# Patient Record
Sex: Male | Born: 1955 | Race: White | Hispanic: No | Smoking: Never smoker
Health system: Southern US, Community
[De-identification: ages and names within clinical notes are randomized; demographics above are authoritative.]

## PROBLEM LIST (undated history)

## (undated) DIAGNOSIS — M5432 Sciatica, left side: Secondary | ICD-10-CM

## (undated) DIAGNOSIS — E291 Testicular hypofunction: Secondary | ICD-10-CM

## (undated) DIAGNOSIS — M1990 Unspecified osteoarthritis, unspecified site: Secondary | ICD-10-CM

## (undated) DIAGNOSIS — K219 Gastro-esophageal reflux disease without esophagitis: Secondary | ICD-10-CM

## (undated) HISTORY — DX: Sciatica, left side: M54.32

## (undated) HISTORY — PX: COLONOSCOPY W/ POLYPECTOMY: SHX1380

## (undated) HISTORY — DX: Testicular hypofunction: E29.1

## (undated) HISTORY — PX: WISDOM TOOTH EXTRACTION: SHX21

---

## 2003-03-12 HISTORY — PX: LUMBAR MICRODISCECTOMY: SHX99

## 2003-05-31 ENCOUNTER — Ambulatory Visit (HOSPITAL_COMMUNITY): Admission: RE | Admit: 2003-05-31 | Discharge: 2003-05-31 | Payer: Self-pay | Admitting: Internal Medicine

## 2003-06-02 ENCOUNTER — Ambulatory Visit (HOSPITAL_COMMUNITY): Admission: RE | Admit: 2003-06-02 | Discharge: 2003-06-02 | Payer: Self-pay | Admitting: Internal Medicine

## 2003-07-14 ENCOUNTER — Encounter: Admission: RE | Admit: 2003-07-14 | Discharge: 2003-07-14 | Payer: Self-pay | Admitting: Neurosurgery

## 2003-07-29 ENCOUNTER — Encounter: Admission: RE | Admit: 2003-07-29 | Discharge: 2003-07-29 | Payer: Self-pay | Admitting: Neurosurgery

## 2003-08-15 ENCOUNTER — Encounter: Admission: RE | Admit: 2003-08-15 | Discharge: 2003-08-15 | Payer: Self-pay | Admitting: Neurosurgery

## 2003-10-03 ENCOUNTER — Ambulatory Visit (HOSPITAL_COMMUNITY): Admission: RE | Admit: 2003-10-03 | Discharge: 2003-10-03 | Payer: Self-pay | Admitting: Neurosurgery

## 2007-08-24 ENCOUNTER — Ambulatory Visit (HOSPITAL_COMMUNITY): Admission: RE | Admit: 2007-08-24 | Discharge: 2007-08-24 | Payer: Self-pay | Admitting: Neurosurgery

## 2007-12-08 ENCOUNTER — Ambulatory Visit: Payer: Self-pay | Admitting: Internal Medicine

## 2010-07-27 NOTE — Op Note (Signed)
NAME:  Harry Levy, Harry Levy                         ACCOUNT NO.:  [id number]   MEDICAL RECORD NO.:  [id number]                   PATIENT TYPE:  OIB   LOCATION:  2899                                 FACILITY:  [facility]   PHYSICIAN:  Kathaleen Maser. Pool, M.D.                 DATE OF BIRTH:  1955-04-11   DATE OF PROCEDURE:  10/03/2003  DATE OF DISCHARGE:                                 OPERATIVE REPORT   ATTENDING PHYSICIAN:  Dr. Julio Sicks.   SERVICE:  Neurosurgery.   PREOPERATIVE DIAGNOSIS:  Left L5-S1 herniated nucleus pulposus with  radiculopathy.   POSTOPERATIVE DIAGNOSIS:  Left L5-S1 herniated nucleus pulposus with  radiculopathy.   PROCEDURE NAME:  Left L5-S1 laminotomy and microdiskectomy.   SURGEON:  Dr. Jordan Likes.   ASSISTANT:  Dr. Wynetta Emery.   ANESTHESIA:  General endotracheal.   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  He  has failed conservative management.  Work up demonstrates evidence of an  enlarged left-sided L5-S1 disk herniation with compression of the thecal sac  and left-sided S1 nerve root.  The patient has been counseled as to his  options.  He has decided to proceed with the left-sided L5-S1 laminotomy and  microdiskectomy in hopes of improving his symptoms.   OPERATIVE NOTE:  The patient was brought to the operating room, placed on  the table in a supine position.  After adequate general endotracheal  anesthesia was achieved, the patient was positioned prone onto a Wilson  frame and appropriately padded.  The lumbar spine was prepped and draped  sterilely.  A 10 blade is used to make a linear skin incision overlying the  L5-S1 interspace.  This was carried down sharply in the midline.  Subperiosteal dissection was performed including the lamina centrally with  the L5-S1 on the left.  A deep self-retaining retractor  was placed.  Intraoperative x-rays were taken.  The  level was confirmed.  A laminotomy  was then performed using a high speed drill and Kerrison rongeurs to remove  the inferior edge of the lamina of L5, medial edge of the L5-S1 facet  joints, superior rim of the S1 lamina.  The ligamentum flavum was then  elevated and resected in the usual fashion using Kerrison rongeurs whereupon  the thecal sac and exiting S1 nerve root were identified.  I performed a  microdissection of the left-sided S1 nerve root, underlying disk herniation.  Epidural veins were flushed, coagulated and cut.  The thecal sac and S1  nerve root were gradually mobilized and carried towards the midline.  A  large amount of free fragment was encountered and dissected free using blunt  nerve hooks.  These were restricted using pituitary rongeurs.  The annulus  itself was grossly torn and bulged upward.  This was then incised with a  15  blade in Hagar fashion.  A wide disk space clean-out was then achieved using  pituitary rongeurs, up and down __________ rongeurs and Epstein curets.  All  elements of the disk herniation were completely excised.  There was no  evidence of any loose debris or disk material remaining in the interspace.  The wound was then irrigated with antibiotic solution.  Gelfoam was placed  topically.  Hemostasis was found to be good.  The thecal sac and nerve roots  were free from injury.  There was no evidence of any further compression.  The wound was then inspected for hemostasis and found to be good.  Gelfoam  was placed topically.  The microscope was removed.  Hemostasis of the  musculature was achieved with electrocautery.  The wound was then closed in  layers with Vicryl sutures.  Steri-Strips and sterile dressings were  applied.  There were no complications.  The patient tolerated the procedure  well and he returns to the recovery room postoperatively.                                               Henry A. Pool, M.D.    HAP/MEDQ  D:  10/03/2003   T:  10/03/2003  Job:  [id number]

## 2013-05-13 ENCOUNTER — Telehealth: Payer: Self-pay | Admitting: Internal Medicine

## 2013-05-13 MED ORDER — AZITHROMYCIN 250 MG PO TABS: ORAL_TABLET | ORAL | Status: DC

## 2013-05-13 NOTE — Telephone Encounter (Signed)
Sinusitis sx's x 1-2 wks - yellow d/c  Zpac

## 2013-05-24 ENCOUNTER — Telehealth: Payer: Self-pay | Admitting: Internal Medicine

## 2013-05-24 MED ORDER — AMOXICILLIN-POT CLAVULANATE 875-125 MG PO TABS: 1.0000 | ORAL_TABLET | Freq: Two times a day (BID) | ORAL | Status: DC

## 2013-05-24 NOTE — Telephone Encounter (Signed)
Sinusitis - still sick Augmentin x 10 d 1 ref

## 2014-08-10 ENCOUNTER — Encounter: Payer: Self-pay | Admitting: Internal Medicine

## 2015-05-11 DIAGNOSIS — H5213 Myopia, bilateral: Secondary | ICD-10-CM | POA: Diagnosis not present

## 2015-05-11 DIAGNOSIS — H524 Presbyopia: Secondary | ICD-10-CM | POA: Diagnosis not present

## 2015-11-24 ENCOUNTER — Other Ambulatory Visit (HOSPITAL_COMMUNITY): Payer: Self-pay | Admitting: Neurosurgery

## 2015-11-24 DIAGNOSIS — M5126 Other intervertebral disc displacement, lumbar region: Secondary | ICD-10-CM

## 2015-11-25 ENCOUNTER — Ambulatory Visit (HOSPITAL_COMMUNITY)
Admission: RE | Admit: 2015-11-25 | Discharge: 2015-11-25 | Disposition: A | Discharge status: Home / Self Care | Payer: 59 | Source: Ambulatory Visit | Attending: Neurosurgery | Admitting: Neurosurgery

## 2015-11-25 DIAGNOSIS — M5136 Other intervertebral disc degeneration, lumbar region: Secondary | ICD-10-CM | POA: Insufficient documentation

## 2015-11-25 DIAGNOSIS — M5126 Other intervertebral disc displacement, lumbar region: Secondary | ICD-10-CM | POA: Insufficient documentation

## 2015-11-25 DIAGNOSIS — M5137 Other intervertebral disc degeneration, lumbosacral region: Secondary | ICD-10-CM | POA: Diagnosis not present

## 2015-11-25 DIAGNOSIS — M5127 Other intervertebral disc displacement, lumbosacral region: Secondary | ICD-10-CM | POA: Diagnosis not present

## 2015-11-25 MED ORDER — GADOBENATE DIMEGLUMINE 529 MG/ML IV SOLN
20.0000 mL | Freq: Once | INTRAVENOUS | Status: AC | PRN
  Administered 2015-11-25: 20 mL via INTRAVENOUS

## 2015-11-30 DIAGNOSIS — M5126 Other intervertebral disc displacement, lumbar region: Secondary | ICD-10-CM | POA: Diagnosis not present

## 2015-11-30 DIAGNOSIS — Z6826 Body mass index (BMI) 26.0-26.9, adult: Secondary | ICD-10-CM | POA: Diagnosis not present

## 2015-12-01 ENCOUNTER — Other Ambulatory Visit: Payer: Self-pay | Admitting: Neurosurgery

## 2015-12-01 DIAGNOSIS — M5126 Other intervertebral disc displacement, lumbar region: Secondary | ICD-10-CM

## 2015-12-07 DIAGNOSIS — D2272 Melanocytic nevi of left lower limb, including hip: Secondary | ICD-10-CM | POA: Diagnosis not present

## 2015-12-07 DIAGNOSIS — D2212 Melanocytic nevi of left eyelid, including canthus: Secondary | ICD-10-CM | POA: Diagnosis not present

## 2015-12-07 DIAGNOSIS — L821 Other seborrheic keratosis: Secondary | ICD-10-CM | POA: Diagnosis not present

## 2015-12-07 DIAGNOSIS — D485 Neoplasm of uncertain behavior of skin: Secondary | ICD-10-CM | POA: Diagnosis not present

## 2015-12-07 DIAGNOSIS — Z85828 Personal history of other malignant neoplasm of skin: Secondary | ICD-10-CM | POA: Diagnosis not present

## 2015-12-07 DIAGNOSIS — B078 Other viral warts: Secondary | ICD-10-CM | POA: Diagnosis not present

## 2015-12-11 ENCOUNTER — Ambulatory Visit
Admission: RE | Admit: 2015-12-11 | Discharge: 2015-12-11 | Disposition: A | Discharge status: Home / Self Care | Payer: 59 | Source: Ambulatory Visit | Attending: Neurosurgery | Admitting: Neurosurgery

## 2015-12-11 ENCOUNTER — Other Ambulatory Visit: Payer: 59

## 2015-12-11 DIAGNOSIS — M5126 Other intervertebral disc displacement, lumbar region: Secondary | ICD-10-CM

## 2015-12-11 MED ORDER — IOPAMIDOL (ISOVUE-M 200) INJECTION 41%
1.0000 mL | Freq: Once | INTRAMUSCULAR | Status: AC
  Administered 2015-12-11: 1 mL via EPIDURAL

## 2015-12-11 MED ORDER — METHYLPREDNISOLONE ACETATE 40 MG/ML INJ SUSP (RADIOLOG
120.0000 mg | Freq: Once | INTRAMUSCULAR | Status: AC
  Administered 2015-12-11: 120 mg via EPIDURAL

## 2015-12-11 NOTE — Discharge Instructions (Signed)

## 2015-12-27 DIAGNOSIS — N5201 Erectile dysfunction due to arterial insufficiency: Secondary | ICD-10-CM | POA: Diagnosis not present

## 2015-12-27 DIAGNOSIS — N400 Enlarged prostate without lower urinary tract symptoms: Secondary | ICD-10-CM | POA: Diagnosis not present

## 2015-12-27 DIAGNOSIS — E291 Testicular hypofunction: Secondary | ICD-10-CM | POA: Diagnosis not present

## 2016-04-11 ENCOUNTER — Telehealth: Payer: Self-pay | Admitting: Internal Medicine

## 2016-04-11 DIAGNOSIS — Z0000 Encounter for general adult medical examination without abnormal findings: Secondary | ICD-10-CM

## 2016-04-11 NOTE — Telephone Encounter (Signed)
Patient is requesting labs to be entered before appt on 2/22 for wellness.  Please let me know when entered so I can follow up.  Thanks

## 2016-04-11 NOTE — Telephone Encounter (Signed)
p 

## 2016-04-11 NOTE — Telephone Encounter (Signed)
Done. Thx.

## 2016-04-12 NOTE — Telephone Encounter (Signed)
Advised patient that labs have been entered

## 2016-05-02 ENCOUNTER — Ambulatory Visit (INDEPENDENT_AMBULATORY_CARE_PROVIDER_SITE_OTHER): Payer: 59 | Admitting: Internal Medicine

## 2016-05-02 ENCOUNTER — Encounter: Payer: Self-pay | Admitting: Internal Medicine

## 2016-05-02 VITALS — BP 136/84 | HR 65 | Temp 98.0°F | Resp 16 | Ht 77.0 in | Wt 223.0 lb

## 2016-05-02 DIAGNOSIS — N529 Male erectile dysfunction, unspecified: Secondary | ICD-10-CM | POA: Insufficient documentation

## 2016-05-02 DIAGNOSIS — Z0000 Encounter for general adult medical examination without abnormal findings: Secondary | ICD-10-CM

## 2016-05-02 DIAGNOSIS — K219 Gastro-esophageal reflux disease without esophagitis: Secondary | ICD-10-CM | POA: Insufficient documentation

## 2016-05-02 DIAGNOSIS — M5410 Radiculopathy, site unspecified: Secondary | ICD-10-CM | POA: Insufficient documentation

## 2016-05-02 DIAGNOSIS — E291 Testicular hypofunction: Secondary | ICD-10-CM | POA: Insufficient documentation

## 2016-05-02 DIAGNOSIS — R202 Paresthesia of skin: Secondary | ICD-10-CM

## 2016-05-02 DIAGNOSIS — Z23 Encounter for immunization: Secondary | ICD-10-CM

## 2016-05-02 MED ORDER — TADALAFIL 20 MG PO TABS: 20.0000 mg | ORAL_TABLET | Freq: Every day | ORAL | 11 refills | Status: DC | PRN

## 2016-05-02 MED ORDER — NORTRIPTYLINE HCL 10 MG PO CAPS: 10.0000 mg | ORAL_CAPSULE | Freq: Every day | ORAL | 11 refills | Status: DC

## 2016-05-02 MED ORDER — SUVOREXANT 15 MG PO TABS: 15.0000 mg | ORAL_TABLET | Freq: Every evening | ORAL | 5 refills | Status: DC | PRN

## 2016-05-02 MED ORDER — VITAMIN D3 50 MCG (2000 UT) PO CAPS: 2000.0000 [IU] | ORAL_CAPSULE | Freq: Every day | ORAL | 3 refills | Status: AC

## 2016-05-02 NOTE — Patient Instructions (Signed)
Try TRE - Trauma Release Exercise  You can get a "Stress Less TRE" application  McKenzie pillow

## 2016-05-02 NOTE — Progress Notes (Signed)
Subjective:  Patient ID: Harry Levy, male    DOB: 24-Aug-1955  Age: 61 y.o. MRN: [id number]  CC: Annual Exam   HPI Harry Levy presents for well exam C/o occ LBP C/o chronic LLE tingling and numbness of the lateral leg/foot C/o insomnis x years - using Benadryl  Outpatient Medications Prior to Visit  Medication Sig Dispense Refill  . amoxicillin-clavulanate (AUGMENTIN) 875-125 MG per tablet Take 1 tablet by mouth 2 (two) times daily. 20 tablet 1   No facility-administered medications prior to visit.     ROS Review of Systems  Constitutional: Negative for appetite change, fatigue and unexpected weight change.  HENT: Negative for congestion, nosebleeds, sneezing, sore throat and trouble swallowing.   Eyes: Negative for itching and visual disturbance.  Respiratory: Negative for cough and shortness of breath.   Cardiovascular: Negative for chest pain, palpitations and leg swelling.  Gastrointestinal: Negative for abdominal distention, blood in stool, diarrhea and nausea.  Endocrine: Negative for cold intolerance.  Genitourinary: Negative for decreased urine volume, frequency, hematuria and urgency.  Musculoskeletal: Positive for back pain. Negative for gait problem, joint swelling and neck pain.  Skin: Negative for color change and rash.  Neurological: Positive for numbness. Negative for dizziness, tremors, speech difficulty, weakness and headaches.  Psychiatric/Behavioral: Positive for sleep disturbance. Negative for agitation, dysphoric mood and suicidal ideas. The patient is not nervous/anxious.   ED  Objective:  BP 136/84   Pulse 65   Temp 98 F (36.7 C) (Oral)   Resp 16   Ht 6\' 5"  (1.956 m)   Wt 223 lb (101.2 kg)   SpO2 98%   BMI 26.44 kg/m   BP Readings from Last 3 Encounters:  05/02/16 136/84  12/11/15 (!) 152/89    Wt Readings from Last 3 Encounters:  05/02/16 223 lb (101.2 kg)    Physical Exam  Constitutional: He is oriented to person, place,  and time. He appears well-developed. No distress.  NAD  HENT:  Mouth/Throat: Oropharynx is clear and moist.  Eyes: Conjunctivae are normal. Pupils are equal, round, and reactive to light.  Neck: Normal range of motion. No JVD present. No thyromegaly present.  Cardiovascular: Normal rate, regular rhythm, normal heart sounds and intact distal pulses.  Exam reveals no gallop and no friction rub.   No murmur heard. Pulmonary/Chest: Effort normal and breath sounds normal. No respiratory distress. He has no wheezes. He has no rales. He exhibits no tenderness.  Abdominal: Soft. Bowel sounds are normal. He exhibits no distension and no mass. There is no tenderness. There is no rebound and no guarding.  Musculoskeletal: Normal range of motion. He exhibits no edema or tenderness.  Lymphadenopathy:    He has no cervical adenopathy.  Neurological: He is alert and oriented to person, place, and time. He has normal reflexes. No cranial nerve deficit. He exhibits normal muscle tone. He displays a negative Romberg sign. Coordination and gait normal.  Skin: Skin is warm and dry. No rash noted.  Psychiatric: He has a normal mood and affect. His behavior is normal. Judgment and thought content normal.  Rectal per urology LS full ROM Str leg elev (-) B L buttock - tender to palpation  No results found for: WBC, HGB, HCT, PLT, GLUCOSE, CHOL, TRIG, HDL, LDLDIRECT, LDLCALC, ALT, AST, NA, K, CL, CREATININE, BUN, CO2, TSH, PSA, INR, GLUF, HGBA1C, MICROALBUR  Dg Inject Diag/thera/inc Needle/cath/plc Epi/lumb/sac W/img  Result Date: 12/11/2015 CLINICAL DATA:  Lumbosacral spondylosis. Left lower extremity L5 and S1  radiculitis. 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 FLUOROSCOPY TIME:  Radiation Exposure Index (as provided by the fluoroscopic device): 24.40 uGy*m2 If the device does not provide the exposure index:Fluoroscopy Time: 18 seconds Number of Acquired Images:  0 PROCEDURE: The procedure, risks,  benefits, and alternatives were explained to the patient. Questions regarding the procedure were encouraged and answered. The patient understands and consents to the procedure. LUMBAR EPIDURAL INJECTION: An interlaminar approach was performed on left at L4-5. The overlying skin was cleansed and anesthetized. A 20 gauge epidural needle was advanced using loss-of-resistance technique. DIAGNOSTIC EPIDURAL INJECTION: Injection of Isovue-M 200 shows a good epidural pattern with spread above and below the level of needle placement, primarily on the left no vascular opacification is seen. THERAPEUTIC EPIDURAL INJECTION: 120 Mg of Depo-Medrol mixed with 3 mL 1% lidocaine were instilled. The procedure was well-tolerated, and the patient was discharged thirty minutes following the injection in good condition. COMPLICATIONS: None IMPRESSION: Technically successful epidural injection on the left L4-5 # 1 Electronically Signed   By: San Morelle M.D.   On: 12/11/2015 15:56    Assessment & Plan:   There are no diagnoses linked to this encounter. I have discontinued Mr. Lynne Leader amoxicillin-clavulanate. I am also having him maintain his cyclobenzaprine, gabapentin, testosterone cypionate, aspirin EC, ibuprofen, Fish Oil, vitamin C, multivitamin, and CoQ10.  Meds ordered this encounter  Medications  . cyclobenzaprine (FLEXERIL) 10 MG tablet    Refill:  1  . gabapentin (NEURONTIN) 300 MG capsule    Refill:  3  . testosterone cypionate (DEPOTESTOSTERONE CYPIONATE) 200 MG/ML injection    Refill:  0  . aspirin EC 81 MG tablet    Sig: Take 81 mg by mouth daily.  Marland Kitchen ibuprofen (ADVIL,MOTRIN) 600 MG tablet    Sig: Take 600 mg by mouth 2 (two) times daily.  . Omega-3 Fatty Acids (FISH OIL) 1000 MG CAPS    Sig: Take 1 capsule by mouth daily.  . vitamin C (ASCORBIC ACID) 500 MG tablet    Sig: Take 500 mg by mouth daily.  . Multiple Vitamin (MULTIVITAMIN) tablet    Sig: Take 1 tablet by mouth daily.  . Coenzyme  Q10 (COQ10) 100 MG CAPS    Sig: Take 1 capsule by mouth daily.     Follow-up: No Follow-up on file.  Walker Kehr, MD

## 2016-05-02 NOTE — Progress Notes (Signed)
Pre-visit discussion using our clinic review tool. No additional management support is needed unless otherwise documented below in the visit note.  

## 2016-05-02 NOTE — Assessment & Plan Note (Signed)
Labs LLE

## 2016-05-02 NOTE — Assessment & Plan Note (Signed)
We discussed age appropriate health related issues, including available/recomended screening tests and vaccinations. We discussed a need for adhering to healthy diet and exercise. Labs  were ordered. All questions were answered. tDAP Shingrix when available Colon up to date

## 2016-05-02 NOTE — Assessment & Plan Note (Signed)
Cialis prn 

## 2016-05-02 NOTE — Assessment & Plan Note (Signed)
Dr Diona Fanti On shots Labs w/Urology q 6 mo

## 2016-05-02 NOTE — Assessment & Plan Note (Addendum)
LLE chronic sciatica +/- piriformis syndrome On Gabapentin Can try Lyrica Nortript at hs Rx B complex  Labs Piriformis stretch PT

## 2016-05-02 NOTE — Assessment & Plan Note (Addendum)
Labs TUMs PRN

## 2016-07-26 ENCOUNTER — Encounter: Payer: Self-pay | Admitting: Internal Medicine

## 2016-09-26 ENCOUNTER — Other Ambulatory Visit (INDEPENDENT_AMBULATORY_CARE_PROVIDER_SITE_OTHER): Payer: 59

## 2016-09-26 ENCOUNTER — Encounter: Payer: Self-pay | Admitting: Internal Medicine

## 2016-09-26 ENCOUNTER — Ambulatory Visit (INDEPENDENT_AMBULATORY_CARE_PROVIDER_SITE_OTHER): Payer: 59 | Admitting: Internal Medicine

## 2016-09-26 VITALS — BP 122/88 | HR 66 | Temp 97.7°F | Resp 16 | Ht 77.0 in | Wt 218.8 lb

## 2016-09-26 DIAGNOSIS — J029 Acute pharyngitis, unspecified: Secondary | ICD-10-CM

## 2016-09-26 DIAGNOSIS — J020 Streptococcal pharyngitis: Secondary | ICD-10-CM | POA: Insufficient documentation

## 2016-09-26 LAB — CBC WITH DIFFERENTIAL/PLATELET
BASOS PCT: 0.8 % (ref 0.0–3.0)
Basophils Absolute: 0.1 10*3/uL (ref 0.0–0.1)
EOS ABS: 0.2 10*3/uL (ref 0.0–0.7)
Eosinophils Relative: 3.3 % (ref 0.0–5.0)
HCT: 46.4 % (ref 39.0–52.0)
Hemoglobin: 16 g/dL (ref 13.0–17.0)
LYMPHS ABS: 1.2 10*3/uL (ref 0.7–4.0)
Lymphocytes Relative: 18.3 % (ref 12.0–46.0)
MCHC: 34.4 g/dL (ref 30.0–36.0)
MCV: 91.3 fl (ref 78.0–100.0)
MONO ABS: 0.6 10*3/uL (ref 0.1–1.0)
Monocytes Relative: 9.5 % (ref 3.0–12.0)
NEUTROS ABS: 4.3 10*3/uL (ref 1.4–7.7)
Neutrophils Relative %: 68.1 % (ref 43.0–77.0)
PLATELETS: 194 10*3/uL (ref 150.0–400.0)
RBC: 5.08 Mil/uL (ref 4.22–5.81)
RDW: 13.5 % (ref 11.5–15.5)
WBC: 6.4 10*3/uL (ref 4.0–10.5)

## 2016-09-26 LAB — COMPREHENSIVE METABOLIC PANEL
ALT: 21 U/L (ref 0–53)
AST: 18 U/L (ref 0–37)
Albumin: 4.1 g/dL (ref 3.5–5.2)
Alkaline Phosphatase: 34 U/L — ABNORMAL LOW (ref 39–117)
BUN: 16 mg/dL (ref 6–23)
CHLORIDE: 104 meq/L (ref 96–112)
CO2: 31 meq/L (ref 19–32)
CREATININE: 1.17 mg/dL (ref 0.40–1.50)
Calcium: 9.4 mg/dL (ref 8.4–10.5)
GFR: 67.86 mL/min (ref >60.00)
Glucose, Bld: 95 mg/dL (ref 70–99)
POTASSIUM: 4.2 meq/L (ref 3.5–5.1)
SODIUM: 139 meq/L (ref 135–145)
Total Bilirubin: 0.6 mg/dL (ref 0.2–1.2)
Total Protein: 6.7 g/dL (ref 6.0–8.3)

## 2016-09-26 LAB — MONONUCLEOSIS SCREEN: Mono Screen: NEGATIVE

## 2016-09-26 LAB — POCT RAPID STREP A (OFFICE): RAPID STREP A SCREEN: POSITIVE — AB

## 2016-09-26 MED ORDER — METHYLPREDNISOLONE 4 MG PO TBPK: ORAL_TABLET | ORAL | 0 refills | Status: DC

## 2016-09-26 MED ORDER — AMOXICILLIN 875 MG PO TABS: 875.0000 mg | ORAL_TABLET | Freq: Two times a day (BID) | ORAL | 0 refills | Status: DC

## 2016-09-26 NOTE — Progress Notes (Signed)
Subjective:  Patient ID: Harry Levy, male    DOB: 05-08-1955  Age: 61 y.o. MRN: [id number]  CC: Sore Throat   HPI Harry Levy presents for day history of sore throat, odynophagia, laryngitis, chills, fatigue, and myalgias.  Outpatient Medications Prior to Visit  Medication Sig Dispense Refill  . aspirin EC 81 MG tablet Take 81 mg by mouth daily.    . Cholecalciferol (VITAMIN D3) 2000 units capsule Take 1 capsule (2,000 Units total) by mouth daily. 100 capsule 3  . Coenzyme Q10 (COQ10) 100 MG CAPS Take 1 capsule by mouth daily.    . cyclobenzaprine (FLEXERIL) 10 MG tablet   1  . gabapentin (NEURONTIN) 300 MG capsule   3  . ibuprofen (ADVIL,MOTRIN) 600 MG tablet Take 600 mg by mouth 2 (two) times daily.    . Multiple Vitamin (MULTIVITAMIN) tablet Take 1 tablet by mouth daily.    . Omega-3 Fatty Acids (FISH OIL) 1000 MG CAPS Take 1 capsule by mouth daily.    Harry Levy Kitchen testosterone cypionate (DEPOTESTOSTERONE CYPIONATE) 200 MG/ML injection   0  . vitamin C (ASCORBIC ACID) 500 MG tablet Take 500 mg by mouth daily.    . tadalafil (CIALIS) 20 MG tablet Take 1 tablet (20 mg total) by mouth daily as needed for erectile dysfunction. 30 tablet 11  . nortriptyline (PAMELOR) 10 MG capsule Take 1-2 capsules (10-20 mg total) by mouth at bedtime. 60 capsule 11  . Suvorexant (BELSOMRA) 15 MG TABS Take 15 mg by mouth at bedtime as needed. 30 tablet 5   No facility-administered medications prior to visit.     ROS Review of Systems  Constitutional: Positive for chills and fatigue. Negative for diaphoresis and fever.  HENT: Positive for sore throat, trouble swallowing and voice change.   Eyes: Negative.   Respiratory: Negative.  Negative for cough, shortness of breath, wheezing and stridor.   Cardiovascular: Negative.  Negative for chest pain, palpitations and leg swelling.  Gastrointestinal: Negative for abdominal pain, diarrhea, nausea and vomiting.  Endocrine: Negative.   Genitourinary:  Negative.  Negative for difficulty urinating.  Musculoskeletal: Positive for myalgias. Negative for arthralgias, back pain and neck pain.  Skin: Negative for color change, pallor and rash.  Allergic/Immunologic: Negative.   Neurological: Negative.   Hematological: Negative for adenopathy. Does not bruise/bleed easily.  Psychiatric/Behavioral: Negative.     Objective:  BP 122/88 (BP Location: Left Arm, Patient Position: Sitting, Cuff Size: Normal)   Pulse 66   Temp 97.7 F (36.5 C) (Oral)   Resp 16   Ht 6\' 5"  (1.956 m)   Wt 218 lb 12 oz (99.2 kg)   SpO2 98%   BMI 25.94 kg/m   BP Readings from Last 3 Encounters:  09/26/16 122/88  05/02/16 136/84  12/11/15 (!) 152/89    Wt Readings from Last 3 Encounters:  09/26/16 218 lb 12 oz (99.2 kg)  05/02/16 223 lb (101.2 kg)    Physical Exam  Constitutional: He is oriented to person, place, and time.  Non-toxic appearance. He does not have a sickly appearance. He does not appear ill. No distress.  HENT:  Right Ear: Hearing, tympanic membrane, external ear and ear canal normal.  Left Ear: Hearing, tympanic membrane, external ear and ear canal normal.  Mouth/Throat: Mucous membranes are normal. Mucous membranes are not pale, not dry and not cyanotic. No oral lesions. No trismus in the jaw. No uvula swelling. Posterior oropharyngeal erythema present. No oropharyngeal exudate, posterior oropharyngeal edema or tonsillar abscesses.  Eyes: Conjunctivae are normal. Right eye exhibits no discharge. Left eye exhibits no discharge. No scleral icterus.  Neck: Normal range of motion. Neck supple. No thyromegaly present.  Cardiovascular: Normal rate, regular rhythm and intact distal pulses.  Exam reveals no gallop.   No murmur heard. Pulmonary/Chest: Effort normal and breath sounds normal. No respiratory distress. He has no wheezes. He has no rales. He exhibits no tenderness.  Abdominal: Soft. Bowel sounds are normal. He exhibits no distension and no  mass. There is no tenderness. There is no rebound and no guarding.  Musculoskeletal: Normal range of motion. He exhibits no edema or tenderness.  Lymphadenopathy:       Head (right side): No submental, no submandibular, no tonsillar and no occipital adenopathy present.       Head (left side): No submental, no submandibular, no tonsillar and no occipital adenopathy present.    He has no cervical adenopathy.    He has no axillary adenopathy.       Right: No supraclavicular adenopathy present.       Left: No supraclavicular adenopathy present.  Neurological: He is alert and oriented to person, place, and time.  Skin: Skin is warm and dry. No rash noted. He is not diaphoretic. No erythema.  Vitals reviewed.   Lab Results  Component Value Date   WBC 6.4 09/26/2016   HGB 16.0 09/26/2016   HCT 46.4 09/26/2016   PLT 194.0 09/26/2016   GLUCOSE 95 09/26/2016   ALT 21 09/26/2016   AST 18 09/26/2016   NA 139 09/26/2016   K 4.2 09/26/2016   CL 104 09/26/2016   CREATININE 1.17 09/26/2016   BUN 16 09/26/2016   CO2 31 09/26/2016    Dg Inject Diag/thera/inc Needle/cath/plc Epi/lumb/sac W/img  Result Date: 12/11/2015 CLINICAL DATA:  Lumbosacral spondylosis. Left lower extremity L5 and S1 radiculitis. 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 FLUOROSCOPY TIME:  Radiation Exposure Index (as provided by the fluoroscopic device): 24.40 uGy*m2 If the device does not provide the exposure index:Fluoroscopy Time: 18 seconds Number of Acquired Images:  0 PROCEDURE: The procedure, risks, benefits, and alternatives were explained to the patient. Questions regarding the procedure were encouraged and answered. The patient understands and consents to the procedure. LUMBAR EPIDURAL INJECTION: An interlaminar approach was performed on left at L4-5. The overlying skin was cleansed and anesthetized. A 20 gauge epidural needle was advanced using loss-of-resistance technique. DIAGNOSTIC EPIDURAL INJECTION:  Injection of Isovue-M 200 shows a good epidural pattern with spread above and below the level of needle placement, primarily on the left no vascular opacification is seen. THERAPEUTIC EPIDURAL INJECTION: 120 Mg of Depo-Medrol mixed with 3 mL 1% lidocaine were instilled. The procedure was well-tolerated, and the patient was discharged thirty minutes following the injection in good condition. COMPLICATIONS: None IMPRESSION: Technically successful epidural injection on the left L4-5 # 1 Electronically Signed   By: San Morelle M.D.   On: 12/11/2015 15:56    Assessment & Plan:   Manfred was seen today for sore throat.  Diagnoses and all orders for this visit:  Sore throat- rapid strep screen is positive so will treat with amoxicillin. Will offer Medrol Dosepak for symptom relief. His mono screen is negative, his CBC and CMP are normal so I don't think he has mononucleosis. -     POCT rapid strep A -     Culture, Group A Strep; Future -     CBC with Differential/Platelet; Future -  Comprehensive metabolic panel; Future -     Mononucleosis screen; Future -     methylPREDNISolone (MEDROL DOSEPAK) 4 MG TBPK tablet; TAKE AS DIRECTED  Strep throat- His rapid strep screen is mildly positive, will treat with amoxicillin. -     amoxicillin (AMOXIL) 875 MG tablet; Take 1 tablet (875 mg total) by mouth 2 (two) times daily.   I have discontinued Mr. Lynne Leader nortriptyline and Suvorexant. I am also having him start on methylPREDNISolone and amoxicillin. Additionally, I am having him maintain his cyclobenzaprine, gabapentin, testosterone cypionate, aspirin EC, ibuprofen, Fish Oil, vitamin C, multivitamin, CoQ10, tadalafil, and Vitamin D3.  Meds ordered this encounter  Medications  . methylPREDNISolone (MEDROL DOSEPAK) 4 MG TBPK tablet    Sig: TAKE AS DIRECTED    Dispense:  21 tablet    Refill:  0  . amoxicillin (AMOXIL) 875 MG tablet    Sig: Take 1 tablet (875 mg total) by mouth 2 (two) times  daily.    Dispense:  20 tablet    Refill:  0     Follow-up: Return if symptoms worsen or fail to improve.  Scarlette Calico, MD

## 2016-09-26 NOTE — Patient Instructions (Signed)
Tonsillitis Tonsillitis is an infection of the throat that causes the tonsils to become red, tender, and swollen. Tonsils are collections of lymphoid tissue at the back of the throat. Each tonsil has crevices (crypts). Tonsils help fight nose and throat infections and keep infection from spreading to other parts of the body for the first 18 months of life. What are the causes? Sudden (acute) tonsillitis is usually caused by infection with streptococcal bacteria. Long-lasting (chronic) tonsillitis occurs when the crypts of the tonsils become filled with pieces of food and bacteria, which makes it easy for the tonsils to become repeatedly infected. What are the signs or symptoms? Symptoms of tonsillitis include:  A sore throat, with possible difficulty swallowing.  White patches on the tonsils.  Fever.  Tiredness.  New episodes of snoring during sleep, when you did not snore before.  Small, foul-smelling, yellowish-white pieces of material (tonsilloliths) that you occasionally cough up or spit out. The tonsilloliths can also cause you to have bad breath.  How is this diagnosed? Tonsillitis can be diagnosed through a physical exam. Diagnosis can be confirmed with the results of lab tests, including a throat culture. How is this treated? The goals of tonsillitis treatment include the reduction of the severity and duration of symptoms and prevention of associated conditions. Symptoms of tonsillitis can be improved with the use of steroids to reduce the swelling. Tonsillitis caused by bacteria can be treated with antibiotic medicines. Usually, treatment with antibiotic medicines is started before the cause of the tonsillitis is known. However, if it is determined that the cause is not bacterial, antibiotic medicines will not treat the tonsillitis. If attacks of tonsillitis are severe and frequent, your health care provider may recommend surgery to remove the tonsils (tonsillectomy). Follow these  instructions at home:  Rest as much as possible and get plenty of sleep.  Drink plenty of fluids. While the throat is very sore, eat soft foods or liquids, such as sherbet, soups, or instant breakfast drinks.  Eat frozen ice pops.  Gargle with a warm or cold liquid to help soothe the throat. Mix 1/4 teaspoon of salt and 1/4 teaspoon of baking soda in 8 oz of water. Contact a health care provider if:  Large, tender lumps develop in your neck.  A rash develops.  A green, yellow-brown, or bloody substance is coughed up.  You are unable to swallow liquids or food for 24 hours.  You notice that only one of the tonsils is swollen. Get help right away if:  You develop any new symptoms such as vomiting, severe headache, stiff neck, chest pain, or trouble breathing or swallowing.  You have severe throat pain along with drooling or voice changes.  You have severe pain, unrelieved with recommended medications.  You are unable to fully open the mouth.  You develop redness, swelling, or severe pain anywhere in the neck.  You have a fever. This information is not intended to replace advice given to you by your health care provider. Make sure you discuss any questions you have with your health care provider. Document Released: 12/05/2004 Document Revised: 08/03/2015 Document Reviewed: 08/14/2012 Elsevier Interactive Patient Education  2017 [employer]  

## 2016-09-27 ENCOUNTER — Encounter: Payer: Self-pay | Admitting: Internal Medicine

## 2016-10-02 ENCOUNTER — Other Ambulatory Visit: Payer: Self-pay | Admitting: Internal Medicine

## 2016-10-02 DIAGNOSIS — J0100 Acute maxillary sinusitis, unspecified: Secondary | ICD-10-CM | POA: Insufficient documentation

## 2016-10-02 DIAGNOSIS — J0101 Acute recurrent maxillary sinusitis: Secondary | ICD-10-CM

## 2016-10-02 MED ORDER — AMOXICILLIN-POT CLAVULANATE 875-125 MG PO TABS: 1.0000 | ORAL_TABLET | Freq: Two times a day (BID) | ORAL | 0 refills | Status: DC

## 2017-02-03 DIAGNOSIS — E291 Testicular hypofunction: Secondary | ICD-10-CM | POA: Diagnosis not present

## 2017-02-03 DIAGNOSIS — N5201 Erectile dysfunction due to arterial insufficiency: Secondary | ICD-10-CM | POA: Diagnosis not present

## 2017-02-03 DIAGNOSIS — N400 Enlarged prostate without lower urinary tract symptoms: Secondary | ICD-10-CM | POA: Diagnosis not present

## 2017-04-15 DIAGNOSIS — D2339 Other benign neoplasm of skin of other parts of face: Secondary | ICD-10-CM | POA: Diagnosis not present

## 2017-04-15 DIAGNOSIS — B078 Other viral warts: Secondary | ICD-10-CM | POA: Diagnosis not present

## 2017-04-15 DIAGNOSIS — Z85828 Personal history of other malignant neoplasm of skin: Secondary | ICD-10-CM | POA: Diagnosis not present

## 2017-04-15 DIAGNOSIS — D485 Neoplasm of uncertain behavior of skin: Secondary | ICD-10-CM | POA: Diagnosis not present

## 2017-05-01 DIAGNOSIS — H524 Presbyopia: Secondary | ICD-10-CM | POA: Diagnosis not present

## 2017-05-01 DIAGNOSIS — H5213 Myopia, bilateral: Secondary | ICD-10-CM | POA: Diagnosis not present

## 2017-05-04 ENCOUNTER — Encounter: Payer: Self-pay | Admitting: Internal Medicine

## 2017-05-06 ENCOUNTER — Other Ambulatory Visit: Payer: Self-pay | Admitting: Internal Medicine

## 2017-05-06 DIAGNOSIS — R748 Abnormal levels of other serum enzymes: Secondary | ICD-10-CM

## 2017-05-07 ENCOUNTER — Telehealth: Payer: Self-pay

## 2017-05-07 NOTE — Telephone Encounter (Signed)
-----   Message from [place], [place] sent at 05/07/2017  8:45 AM EST ----- Regarding: FW: shingrix   ----- Message ----- From: Cassandria Anger, MD Sent: 05/06/2017   5:31 PM To: Inez Catalina Friedenbach, CMA Subject: shingrix                                       Inez Catalina, Please arrange for shingrix vaccine. Thanks, AP

## 2017-05-07 NOTE — Telephone Encounter (Signed)
Patient has been added to shingrix waitlist 

## 2017-07-01 ENCOUNTER — Encounter: Payer: Self-pay | Admitting: Internal Medicine

## 2017-07-02 ENCOUNTER — Encounter: Payer: Self-pay | Admitting: Internal Medicine

## 2017-07-03 ENCOUNTER — Telehealth: Payer: Self-pay

## 2017-07-03 NOTE — Telephone Encounter (Signed)
Left message advising patient to call back to schedule nurse visit to get first shingrix vaccine

## 2017-07-04 ENCOUNTER — Other Ambulatory Visit: Payer: Self-pay | Admitting: Internal Medicine

## 2017-07-04 MED ORDER — ZOLPIDEM TARTRATE ER 12.5 MG PO TBCR: 12.5000 mg | EXTENDED_RELEASE_TABLET | Freq: Every evening | ORAL | 3 refills | Status: DC | PRN

## 2017-07-18 ENCOUNTER — Telehealth: Payer: Self-pay

## 2017-07-18 NOTE — Telephone Encounter (Signed)
Copied from [place] [id number]. Topic: Quick Communication - Office Called Patient >> Jul 03, 2017 10:20 AM Ander Slade, RN wrote: Reason for CRM: patient needs to schedule nurse visit to get first shingrix vaccine---let Adam Sanjuan,RN at [facility] know if appt is made so that both vaccines can be labeled with patient's name  Both vaccines labeled and placed in refrig

## 2017-07-18 NOTE — Telephone Encounter (Signed)
Copied from [place] [phone]. Topic: Quick Communication - Office Called Patient >> Jul 03, 2017 10:20 AM Ander Slade, RN wrote: Reason for CRM: patient needs to schedule nurse visit to get first shingrix vaccine---let Merle Cirelli,RN at [facility] office know if appt is made so that both vaccines can be labeled with patient's name >> Jul 18, 2017  8:22 AM Robina Ade, Helene Kelp D wrote: Patient schedule shingle vaccine appt for 07/24/17

## 2017-07-24 ENCOUNTER — Ambulatory Visit (INDEPENDENT_AMBULATORY_CARE_PROVIDER_SITE_OTHER): Payer: 59 | Admitting: *Deleted

## 2017-07-24 DIAGNOSIS — Z23 Encounter for immunization: Secondary | ICD-10-CM | POA: Diagnosis not present

## 2017-10-01 ENCOUNTER — Other Ambulatory Visit (INDEPENDENT_AMBULATORY_CARE_PROVIDER_SITE_OTHER): Payer: 59

## 2017-10-01 DIAGNOSIS — R202 Paresthesia of skin: Secondary | ICD-10-CM

## 2017-10-01 DIAGNOSIS — R748 Abnormal levels of other serum enzymes: Secondary | ICD-10-CM

## 2017-10-01 DIAGNOSIS — K219 Gastro-esophageal reflux disease without esophagitis: Secondary | ICD-10-CM | POA: Diagnosis not present

## 2017-10-01 LAB — CBC WITH DIFFERENTIAL/PLATELET
BASOS ABS: 0.1 10*3/uL (ref 0.0–0.1)
Basophils Relative: 1.1 % (ref 0.0–3.0)
Eosinophils Absolute: 0.2 10*3/uL (ref 0.0–0.7)
Eosinophils Relative: 4.1 % (ref 0.0–5.0)
HEMATOCRIT: 49.5 % (ref 39.0–52.0)
HEMOGLOBIN: 16.9 g/dL (ref 13.0–17.0)
LYMPHS PCT: 27.3 % (ref 12.0–46.0)
Lymphs Abs: 1.6 10*3/uL (ref 0.7–4.0)
MCHC: 34.2 g/dL (ref 30.0–36.0)
MCV: 93.7 fl (ref 78.0–100.0)
MONOS PCT: 8.3 % (ref 3.0–12.0)
Monocytes Absolute: 0.5 10*3/uL (ref 0.1–1.0)
NEUTROS ABS: 3.4 10*3/uL (ref 1.4–7.7)
Neutrophils Relative %: 59.2 % (ref 43.0–77.0)
PLATELETS: 194 10*3/uL (ref 150.0–400.0)
RBC: 5.28 Mil/uL (ref 4.22–5.81)
RDW: 13.7 % (ref 11.5–15.5)
WBC: 5.8 10*3/uL (ref 4.0–10.5)

## 2017-10-01 LAB — HEPATIC FUNCTION PANEL
ALBUMIN: 4.1 g/dL (ref 3.5–5.2)
ALT: 28 U/L (ref 0–53)
AST: 17 U/L (ref 0–37)
Alkaline Phosphatase: 32 U/L — ABNORMAL LOW (ref 39–117)
Bilirubin, Direct: 0.1 mg/dL (ref 0.0–0.3)
TOTAL PROTEIN: 7.1 g/dL (ref 6.0–8.3)
Total Bilirubin: 0.4 mg/dL (ref 0.2–1.2)

## 2017-10-01 LAB — LIPID PANEL
Cholesterol: 170 mg/dL (ref 0–200)
HDL: 58.6 mg/dL (ref >39.00)
LDL CALC: 102 mg/dL — AB (ref 0–99)
NONHDL: 111.56
TRIGLYCERIDES: 49 mg/dL (ref 0.0–149.0)
Total CHOL/HDL Ratio: 3
VLDL: 9.8 mg/dL (ref 0.0–40.0)

## 2017-10-01 LAB — VITAMIN D 25 HYDROXY (VIT D DEFICIENCY, FRACTURES): VITD: 39.86 ng/mL (ref 30.00–100.00)

## 2017-10-02 ENCOUNTER — Ambulatory Visit (INDEPENDENT_AMBULATORY_CARE_PROVIDER_SITE_OTHER): Payer: 59

## 2017-10-02 DIAGNOSIS — Z23 Encounter for immunization: Secondary | ICD-10-CM

## 2017-10-02 LAB — HEPATITIS C ANTIBODY
Hepatitis C Ab: NONREACTIVE
SIGNAL TO CUT-OFF: 0.01 (ref <1.00)

## 2017-12-04 ENCOUNTER — Other Ambulatory Visit: Payer: Self-pay | Admitting: Internal Medicine

## 2017-12-08 ENCOUNTER — Other Ambulatory Visit: Payer: Self-pay | Admitting: Internal Medicine

## 2017-12-08 MED ORDER — ZOLPIDEM TARTRATE ER 12.5 MG PO TBCR: 12.5000 mg | EXTENDED_RELEASE_TABLET | Freq: Every evening | ORAL | 3 refills | Status: DC | PRN

## 2018-01-30 ENCOUNTER — Encounter: Payer: Self-pay | Admitting: Internal Medicine

## 2018-03-16 DIAGNOSIS — N400 Enlarged prostate without lower urinary tract symptoms: Secondary | ICD-10-CM | POA: Diagnosis not present

## 2018-03-16 DIAGNOSIS — N5201 Erectile dysfunction due to arterial insufficiency: Secondary | ICD-10-CM | POA: Diagnosis not present

## 2018-03-16 DIAGNOSIS — E291 Testicular hypofunction: Secondary | ICD-10-CM | POA: Diagnosis not present

## 2018-04-09 ENCOUNTER — Encounter: Payer: Self-pay | Admitting: Internal Medicine

## 2018-04-09 ENCOUNTER — Ambulatory Visit (INDEPENDENT_AMBULATORY_CARE_PROVIDER_SITE_OTHER): Payer: 59 | Admitting: Internal Medicine

## 2018-04-09 VITALS — BP 124/76 | HR 63 | Temp 98.0°F | Ht 77.0 in | Wt 211.0 lb

## 2018-04-09 DIAGNOSIS — Z0000 Encounter for general adult medical examination without abnormal findings: Secondary | ICD-10-CM | POA: Diagnosis not present

## 2018-04-09 DIAGNOSIS — G4700 Insomnia, unspecified: Secondary | ICD-10-CM | POA: Insufficient documentation

## 2018-04-09 DIAGNOSIS — K219 Gastro-esophageal reflux disease without esophagitis: Secondary | ICD-10-CM

## 2018-04-09 DIAGNOSIS — E291 Testicular hypofunction: Secondary | ICD-10-CM

## 2018-04-09 DIAGNOSIS — F5101 Primary insomnia: Secondary | ICD-10-CM

## 2018-04-09 MED ORDER — ZOLPIDEM TARTRATE ER 12.5 MG PO TBCR: 12.5000 mg | EXTENDED_RELEASE_TABLET | Freq: Every evening | ORAL | 1 refills | Status: DC | PRN

## 2018-04-09 MED ORDER — GABAPENTIN 300 MG PO CAPS: 300.0000 mg | ORAL_CAPSULE | Freq: Three times a day (TID) | ORAL | 3 refills | Status: DC

## 2018-04-09 MED ORDER — TRAMADOL HCL 50 MG PO TABS: 50.0000 mg | ORAL_TABLET | Freq: Four times a day (QID) | ORAL | 1 refills | Status: DC | PRN

## 2018-04-09 MED ORDER — FAMOTIDINE 40 MG PO TABS: 40.0000 mg | ORAL_TABLET | Freq: Every day | ORAL | 3 refills | Status: DC

## 2018-04-09 MED ORDER — CYCLOBENZAPRINE HCL 10 MG PO TABS: 10.0000 mg | ORAL_TABLET | Freq: Three times a day (TID) | ORAL | 1 refills | Status: DC | PRN

## 2018-04-09 NOTE — Assessment & Plan Note (Signed)
Chronic, refractory Ambien CR Weighted blanket, Valerian root

## 2018-04-09 NOTE — Assessment & Plan Note (Signed)
Dr Diona Fanti On Testosterone shots

## 2018-04-09 NOTE — Assessment & Plan Note (Signed)
Pepcid H pylori test

## 2018-04-09 NOTE — Progress Notes (Signed)
Subjective:  Patient ID: Harry Levy, male    DOB: [birth year]  Age: 63 y.o. MRN: [id number]  CC: No chief complaint on file.   HPI Harry Levy presents for a well exam C/o LBP, insomnia   Outpatient Medications Prior to Visit  Medication Sig Dispense Refill  . aspirin EC 81 MG tablet Take 81 mg by mouth daily.    . Cholecalciferol (VITAMIN D3) 2000 units capsule Take 1 capsule (2,000 Units total) by mouth daily. 100 capsule 3  . Coenzyme Q10 (COQ10) 100 MG CAPS Take 1 capsule by mouth daily.    Marland Kitchen ibuprofen (ADVIL,MOTRIN) 600 MG tablet Take 600 mg by mouth 2 (two) times daily.    . Multiple Vitamin (MULTIVITAMIN) tablet Take 1 tablet by mouth daily.    . Omega-3 Fatty Acids (FISH OIL) 1000 MG CAPS Take 1 capsule by mouth daily.    Marland Kitchen testosterone cypionate (DEPOTESTOSTERONE CYPIONATE) 200 MG/ML injection every 14 (fourteen) days.   0  . vitamin C (ASCORBIC ACID) 500 MG tablet Take 500 mg by mouth daily.    . cyclobenzaprine (FLEXERIL) 10 MG tablet 3 (three) times daily as needed.   1  . gabapentin (NEURONTIN) 300 MG capsule 3 (three) times daily.   3  . traMADol (ULTRAM) 50 MG tablet Take by mouth 3 (three) times daily as needed.    . zolpidem (AMBIEN CR) 12.5 MG CR tablet Take 1 tablet (12.5 mg total) by mouth at bedtime as needed for sleep. 30 tablet 3  . tadalafil (CIALIS) 20 MG tablet Take 1 tablet (20 mg total) by mouth daily as needed for erectile dysfunction. 30 tablet 11  . amoxicillin-clavulanate (AUGMENTIN) 875-125 MG tablet Take 1 tablet by mouth 2 (two) times daily. 20 tablet 0  . methylPREDNISolone (MEDROL DOSEPAK) 4 MG TBPK tablet TAKE AS DIRECTED 21 tablet 0   No facility-administered medications prior to visit.     ROS: Review of Systems  Constitutional: Negative for appetite change, fatigue and unexpected weight change.  HENT: Negative for congestion, nosebleeds, sneezing, sore throat and trouble swallowing.   Eyes: Negative for itching and visual  disturbance.  Respiratory: Negative for cough.   Cardiovascular: Negative for chest pain, palpitations and leg swelling.  Gastrointestinal: Negative for abdominal distention, blood in stool, diarrhea and nausea.  Genitourinary: Negative for frequency and hematuria.  Musculoskeletal: Positive for back pain. Negative for gait problem, joint swelling and neck pain.  Skin: Negative for rash.  Neurological: Negative for dizziness, tremors, speech difficulty and weakness.  Psychiatric/Behavioral: Positive for sleep disturbance. Negative for agitation, dysphoric mood and suicidal ideas. The patient is not nervous/anxious.     Objective:  BP 124/76 (BP Location: Left Arm, Patient Position: Sitting, Cuff Size: Large)   Pulse 63   Temp 98 F (36.7 C) (Oral)   Ht 6\' 5"  (1.956 m)   Wt 211 lb (95.7 kg)   SpO2 97%   BMI 25.02 kg/m   BP Readings from Last 3 Encounters:  04/09/18 124/76  09/26/16 122/88  05/02/16 136/84    Wt Readings from Last 3 Encounters:  04/09/18 211 lb (95.7 kg)  09/26/16 218 lb 12 oz (99.2 kg)  05/02/16 223 lb (101.2 kg)    Physical Exam Constitutional:      General: He is not in acute distress.    Appearance: He is well-developed.     Comments: NAD  Eyes:     Conjunctiva/sclera: Conjunctivae normal.     Pupils: Pupils are equal, round,  and reactive to light.  Neck:     Musculoskeletal: Normal range of motion.     Thyroid: No thyromegaly.     Vascular: No JVD.  Cardiovascular:     Rate and Rhythm: Normal rate and regular rhythm.     Heart sounds: Normal heart sounds. No murmur. No friction rub. No gallop.   Pulmonary:     Effort: Pulmonary effort is normal. No respiratory distress.     Breath sounds: Normal breath sounds. No wheezing or rales.  Chest:     Chest wall: No tenderness.  Abdominal:     General: Bowel sounds are normal. There is no distension.     Palpations: Abdomen is soft. There is no mass.     Tenderness: There is no abdominal  tenderness. There is no guarding or rebound.  Musculoskeletal: Normal range of motion.        General: No tenderness.  Lymphadenopathy:     Cervical: No cervical adenopathy.  Skin:    General: Skin is warm and dry.     Findings: No rash.  Neurological:     Mental Status: He is alert and oriented to person, place, and time.     Cranial Nerves: No cranial nerve deficit.     Motor: No abnormal muscle tone.     Coordination: Coordination normal.     Gait: Gait normal.     Deep Tendon Reflexes: Reflexes are normal and symmetric.  Psychiatric:        Behavior: Behavior normal.        Thought Content: Thought content normal.        Judgment: Judgment normal.   LS tender Rectal - per Urology  Lab Results  Component Value Date   WBC 5.8 10/01/2017   HGB 16.9 10/01/2017   HCT 49.5 10/01/2017   PLT 194.0 10/01/2017   GLUCOSE 95 09/26/2016   CHOL 170 10/01/2017   TRIG 49.0 10/01/2017   HDL 58.60 10/01/2017   LDLCALC 102 (H) 10/01/2017   ALT 28 10/01/2017   AST 17 10/01/2017   NA 139 09/26/2016   K 4.2 09/26/2016   CL 104 09/26/2016   CREATININE 1.17 09/26/2016   BUN 16 09/26/2016   CO2 31 09/26/2016    Dg Inject Diag/thera/inc Needle/cath/plc Epi/lumb/sac W/img  Result Date: 12/11/2015 CLINICAL DATA:  Lumbosacral spondylosis. Left lower extremity L5 and S1 radiculitis. 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 FLUOROSCOPY TIME:  Radiation Exposure Index (as provided by the fluoroscopic device): 24.40 uGy*m2 If the device does not provide the exposure index:Fluoroscopy Time: 18 seconds Number of Acquired Images:  0 PROCEDURE: The procedure, risks, benefits, and alternatives were explained to the patient. Questions regarding the procedure were encouraged and answered. The patient understands and consents to the procedure. LUMBAR EPIDURAL INJECTION: An interlaminar approach was performed on left at L4-5. The overlying skin was cleansed and anesthetized. A 20 gauge epidural  needle was advanced using loss-of-resistance technique. DIAGNOSTIC EPIDURAL INJECTION: Injection of Isovue-M 200 shows a good epidural pattern with spread above and below the level of needle placement, primarily on the left no vascular opacification is seen. THERAPEUTIC EPIDURAL INJECTION: 120 Mg of Depo-Medrol mixed with 3 mL 1% lidocaine were instilled. The procedure was well-tolerated, and the patient was discharged thirty minutes following the injection in good condition. COMPLICATIONS: None IMPRESSION: Technically successful epidural injection on the left L4-5 # 1 Electronically Signed   By: San Morelle M.D.   On: 12/11/2015 15:56  Assessment & Plan:   Diagnoses and all orders for this visit:  Well adult exam -     Basic metabolic panel; Future -     CBC with Differential/Platelet; Future -     H. pylori antibody, IgG; Future -     Hepatic function panel; Future -     Hepatitis C antibody; Future -     Lipid panel; Future -     Vitamin B12; Future -     Urinalysis; Future -     TSH; Future  Gastroesophageal reflux disease without esophagitis -     H. pylori antibody, IgG; Future -     Vitamin B12; Future  Other orders -     traMADol (ULTRAM) 50 MG tablet; Take 1 tablet (50 mg total) by mouth every 6 (six) hours as needed. -     cyclobenzaprine (FLEXERIL) 10 MG tablet; Take 1 tablet (10 mg total) by mouth 3 (three) times daily as needed. -     zolpidem (AMBIEN CR) 12.5 MG CR tablet; Take 1 tablet (12.5 mg total) by mouth at bedtime as needed for sleep. -     famotidine (PEPCID) 40 MG tablet; Take 1 tablet (40 mg total) by mouth daily. -     gabapentin (NEURONTIN) 300 MG capsule; Take 1 capsule (300 mg total) by mouth 3 (three) times daily.     Meds ordered this encounter  Medications  . traMADol (ULTRAM) 50 MG tablet    Sig: Take 1 tablet (50 mg total) by mouth every 6 (six) hours as needed.    Dispense:  120 tablet    Refill:  1  . cyclobenzaprine (FLEXERIL) 10  MG tablet    Sig: Take 1 tablet (10 mg total) by mouth 3 (three) times daily as needed.    Dispense:  90 tablet    Refill:  1  . zolpidem (AMBIEN CR) 12.5 MG CR tablet    Sig: Take 1 tablet (12.5 mg total) by mouth at bedtime as needed for sleep.    Dispense:  90 tablet    Refill:  1  . famotidine (PEPCID) 40 MG tablet    Sig: Take 1 tablet (40 mg total) by mouth daily.    Dispense:  90 tablet    Refill:  3  . gabapentin (NEURONTIN) 300 MG capsule    Sig: Take 1 capsule (300 mg total) by mouth 3 (three) times daily.    Dispense:  270 capsule    Refill:  3     Follow-up: No follow-ups on file.  Walker Kehr, MD

## 2018-04-09 NOTE — Addendum Note (Signed)
Addended by: Cassandria Anger on: 04/09/2018 05:12 PM   Modules accepted: Level of Service

## 2018-04-09 NOTE — Assessment & Plan Note (Signed)
We discussed age appropriate health related issues, including available/recomended screening tests and vaccinations. We discussed a need for adhering to healthy diet and exercise. Labs were ordered to be later reviewed . All questions were answered. Colon 2009 Dr [name], due now Offered cardiac CT scan for calcium scoring

## 2018-04-09 NOTE — Patient Instructions (Addendum)
Cardiac CT calcium scoring test $150   Computed tomography, more commonly known as a CT or CAT scan, is a diagnostic medical imaging test. Like traditional x-rays, it produces multiple images or pictures of the inside of the body. The cross-sectional images generated during a CT scan can be reformatted in multiple planes. They can even generate three-dimensional images. These images can be viewed on a computer monitor, printed on film or by a 3D printer, or transferred to a CD or DVD. CT images of internal organs, bones, soft tissue and blood vessels provide greater detail than traditional x-rays, particularly of soft tissues and blood vessels. A cardiac CT scan for coronary calcium is a non-invasive way of obtaining information about the presence, location and extent of calcified plaque in the coronary arteries-the vessels that supply oxygen-containing blood to the heart muscle. Calcified plaque results when there is a build-up of fat and other substances under the inner layer of the artery. This material can calcify which signals the presence of atherosclerosis, a disease of the vessel wall, also called coronary artery disease (CAD). People with this disease have an increased risk for heart attacks. In addition, over time, progression of plaque build up (CAD) can narrow the arteries or even close off blood flow to the heart. The result may be chest pain, sometimes called "angina," or a heart attack. Because calcium is a marker of CAD, the amount of calcium detected on a cardiac CT scan is a helpful prognostic tool. The findings on cardiac CT are expressed as a calcium score. Another name for this test is coronary artery calcium scoring.  What are some common uses of the procedure? The goal of cardiac CT scan for calcium scoring is to determine if CAD is present and to what extent, even if there are no symptoms. It is a screening study that may be recommended by a physician for patients with risk factors  for CAD but no clinical symptoms. The major risk factors for CAD are: . high blood cholesterol levels  . family history of heart attacks  . diabetes  . high blood pressure  . cigarette smoking  . overweight or obese  . physical inactivity   A negative cardiac CT scan for calcium scoring shows no calcification within the coronary arteries. This suggests that CAD is absent or so minimal it cannot be seen by this technique. The chance of having a heart attack over the next two to five years is very low under these circumstances. A positive test means that CAD is present, regardless of whether or not the patient is experiencing any symptoms. The amount of calcification-expressed as the calcium score-may help to predict the likelihood of a myocardial infarction (heart attack) in the coming years and helps your medical doctor or cardiologist decide whether the patient may need to take preventive medicine or undertake other measures such as diet and exercise to lower the risk for heart attack. The extent of CAD is graded according to your calcium score:  Calcium Score  Presence of CAD  0 No evidence of CAD   1-10 Minimal evidence of CAD  11-100 Mild evidence of CAD  101-400 Moderate evidence of CAD  Over 400 Extensive evidence of CAD   Weighted blanket

## 2018-04-10 ENCOUNTER — Other Ambulatory Visit (INDEPENDENT_AMBULATORY_CARE_PROVIDER_SITE_OTHER): Payer: 59

## 2018-04-10 DIAGNOSIS — Z0000 Encounter for general adult medical examination without abnormal findings: Secondary | ICD-10-CM | POA: Diagnosis not present

## 2018-04-10 DIAGNOSIS — K219 Gastro-esophageal reflux disease without esophagitis: Secondary | ICD-10-CM | POA: Diagnosis not present

## 2018-04-10 LAB — BASIC METABOLIC PANEL
BUN: 24 mg/dL — ABNORMAL HIGH (ref 6–23)
CO2: 26 mEq/L (ref 19–32)
Calcium: 9.3 mg/dL (ref 8.4–10.5)
Chloride: 103 mEq/L (ref 96–112)
Creatinine, Ser: 1.16 mg/dL (ref 0.40–1.50)
GFR: 64.14 mL/min (ref >60.00)
Glucose, Bld: 106 mg/dL — ABNORMAL HIGH (ref 70–99)
Potassium: 4.3 mEq/L (ref 3.5–5.1)
Sodium: 137 mEq/L (ref 135–145)

## 2018-04-10 LAB — CBC WITH DIFFERENTIAL/PLATELET
BASOS PCT: 1 % (ref 0.0–3.0)
Basophils Absolute: 0.1 10*3/uL (ref 0.0–0.1)
Eosinophils Absolute: 0.1 10*3/uL (ref 0.0–0.7)
Eosinophils Relative: 2.7 % (ref 0.0–5.0)
HCT: 49.1 % (ref 39.0–52.0)
Hemoglobin: 16.8 g/dL (ref 13.0–17.0)
Lymphocytes Relative: 37.3 % (ref 12.0–46.0)
Lymphs Abs: 1.9 10*3/uL (ref 0.7–4.0)
MCHC: 34.2 g/dL (ref 30.0–36.0)
MCV: 93.6 fl (ref 78.0–100.0)
Monocytes Absolute: 0.4 10*3/uL (ref 0.1–1.0)
Monocytes Relative: 8.4 % (ref 3.0–12.0)
Neutro Abs: 2.6 10*3/uL (ref 1.4–7.7)
Neutrophils Relative %: 50.6 % (ref 43.0–77.0)
Platelets: 199 10*3/uL (ref 150.0–400.0)
RBC: 5.25 Mil/uL (ref 4.22–5.81)
RDW: 13.5 % (ref 11.5–15.5)
WBC: 5.2 10*3/uL (ref 4.0–10.5)

## 2018-04-10 LAB — URINALYSIS
Bilirubin Urine: NEGATIVE
Hgb urine dipstick: NEGATIVE
KETONES UR: NEGATIVE
Leukocytes, UA: NEGATIVE
Nitrite: NEGATIVE
Specific Gravity, Urine: 1.02 (ref 1.000–1.030)
Total Protein, Urine: NEGATIVE
Urine Glucose: NEGATIVE
Urobilinogen, UA: 0.2 (ref 0.0–1.0)
pH: 5.5 (ref 5.0–8.0)

## 2018-04-10 LAB — HEPATIC FUNCTION PANEL
ALK PHOS: 31 U/L — AB (ref 39–117)
ALT: 24 U/L (ref 0–53)
AST: 18 U/L (ref 0–37)
Albumin: 4.2 g/dL (ref 3.5–5.2)
Bilirubin, Direct: 0.1 mg/dL (ref 0.0–0.3)
Total Bilirubin: 0.5 mg/dL (ref 0.2–1.2)
Total Protein: 6.8 g/dL (ref 6.0–8.3)

## 2018-04-10 LAB — LIPID PANEL
Cholesterol: 182 mg/dL (ref 0–200)
HDL: 53.7 mg/dL (ref >39.00)
LDL Cholesterol: 118 mg/dL — ABNORMAL HIGH (ref 0–99)
NonHDL: 128.15
Total CHOL/HDL Ratio: 3
Triglycerides: 52 mg/dL (ref 0.0–149.0)
VLDL: 10.4 mg/dL (ref 0.0–40.0)

## 2018-04-10 LAB — H. PYLORI ANTIBODY, IGG: H PYLORI IGG: NEGATIVE

## 2018-04-10 LAB — TSH: TSH: 1.53 u[IU]/mL (ref 0.35–4.50)

## 2018-04-10 LAB — VITAMIN B12: Vitamin B-12: 435 pg/mL (ref 211–911)

## 2018-04-11 LAB — HEPATITIS C ANTIBODY
HEP C AB: NONREACTIVE
SIGNAL TO CUT-OFF: 0.01 (ref <1.00)

## 2018-05-14 ENCOUNTER — Other Ambulatory Visit: Payer: Self-pay | Admitting: Internal Medicine

## 2018-05-14 ENCOUNTER — Telehealth: Payer: Self-pay | Admitting: Internal Medicine

## 2018-05-14 ENCOUNTER — Ambulatory Visit: Payer: Self-pay | Admitting: *Deleted

## 2018-05-14 DIAGNOSIS — J111 Influenza due to unidentified influenza virus with other respiratory manifestations: Secondary | ICD-10-CM | POA: Insufficient documentation

## 2018-05-14 MED ORDER — OSELTAMIVIR PHOSPHATE 75 MG PO CAPS: 75.0000 mg | ORAL_CAPSULE | Freq: Two times a day (BID) | ORAL | 0 refills | Status: AC

## 2018-05-14 NOTE — Telephone Encounter (Signed)
Pt requesting tamiflu; he has a sore throat that started 05/13/2018; multiple cases of flu at work; recommendations made per nurse triage protocol; the pt verifies his pharmacy [facility]; he can be contacted at [phone] and a message can be left on voicemail; will route to office for final disposition.    Reason for Disposition . 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 TYPE of EXPOSURE: "How were you exposed?" (e.g., close contact, not a close contact)    Close contact with personnel who tested positive flu   2. DATE of EXPOSURE: "When did the exposure occur?" (e.g., hour, days, weeks)     days 3. PREGNANCY: "Is there any chance you are pregnant?" "When was your last menstrual period?"     n/a 4. HIGH RISK for COMPLICATIONS: "Do you have any heart or lung problems? Do you have a weakened immune system?" (e.g., CHF, COPD, asthma, HIV positive, chemotherapy, renal failure, diabetes mellitus, sickle cell anemia)     no 5. SYMPTOMS: "Do you have any symptoms?" (e.g., cough, fever, sore throat, difficulty breathing).     Sore throat  Protocols used: INFLUENZA EXPOSURE-A-AH

## 2018-05-14 NOTE — Telephone Encounter (Signed)
Patient states he has been exposed to the flu.  States flu like symptoms have started today.  He is requesting tamiflu to be sent to [facility] outpatient.

## 2018-05-15 NOTE — Telephone Encounter (Signed)
See other TE.

## 2018-05-20 ENCOUNTER — Other Ambulatory Visit: Payer: Self-pay | Admitting: Internal Medicine

## 2018-05-20 MED ORDER — OSELTAMIVIR PHOSPHATE 75 MG PO CAPS: 75.0000 mg | ORAL_CAPSULE | Freq: Every day | ORAL | 0 refills | Status: DC

## 2018-06-22 DIAGNOSIS — L723 Sebaceous cyst: Secondary | ICD-10-CM | POA: Diagnosis not present

## 2018-06-24 ENCOUNTER — Telehealth: Payer: Self-pay | Admitting: Internal Medicine

## 2018-06-24 NOTE — Telephone Encounter (Signed)
Recv'd records from [facility] forwarded 3 pages to Dr. Lew Dawes 4/15/20fbg.

## 2018-09-22 ENCOUNTER — Other Ambulatory Visit: Payer: Self-pay | Admitting: Internal Medicine

## 2018-09-22 MED ORDER — ZOLPIDEM TARTRATE ER 12.5 MG PO TBCR: 12.5000 mg | EXTENDED_RELEASE_TABLET | Freq: Every evening | ORAL | 1 refills | Status: DC | PRN

## 2018-09-27 DIAGNOSIS — H10811 Pingueculitis, right eye: Secondary | ICD-10-CM | POA: Diagnosis not present

## 2018-10-27 ENCOUNTER — Other Ambulatory Visit: Payer: Self-pay

## 2018-10-27 DIAGNOSIS — K219 Gastro-esophageal reflux disease without esophagitis: Secondary | ICD-10-CM

## 2018-10-27 DIAGNOSIS — Z1211 Encounter for screening for malignant neoplasm of colon: Secondary | ICD-10-CM

## 2018-10-27 MED ORDER — NA SULFATE-K SULFATE-MG SULF 17.5-3.13-1.6 GM/177ML PO SOLN: ORAL | 0 refills | Status: DC

## 2018-11-04 ENCOUNTER — Encounter: Payer: Self-pay | Admitting: Internal Medicine

## 2018-11-16 ENCOUNTER — Telehealth: Payer: Self-pay

## 2018-11-16 NOTE — Telephone Encounter (Signed)
Covid-19 screening questions   Do you now or have you had a fever in the last 14 days?  Do you have any respiratory symptoms of shortness of breath or cough now or in the last 14 days?  Do you have any family members or close contacts with diagnosed or suspected Covid-19 in the past 14 days?  Have you been tested for Covid-19 and found to be positive?       

## 2018-11-16 NOTE — Telephone Encounter (Signed)
Patient called back and answered no to all questions. °

## 2018-11-17 ENCOUNTER — Other Ambulatory Visit: Payer: Self-pay

## 2018-11-17 ENCOUNTER — Ambulatory Visit (AMBULATORY_SURGERY_CENTER): Payer: 59 | Admitting: Internal Medicine

## 2018-11-17 ENCOUNTER — Encounter: Payer: Self-pay | Admitting: Internal Medicine

## 2018-11-17 VITALS — BP 106/68 | HR 64 | Temp 98.0°F | Resp 13 | Ht 77.0 in | Wt 211.0 lb

## 2018-11-17 DIAGNOSIS — K635 Polyp of colon: Secondary | ICD-10-CM | POA: Diagnosis not present

## 2018-11-17 DIAGNOSIS — K219 Gastro-esophageal reflux disease without esophagitis: Secondary | ICD-10-CM | POA: Diagnosis not present

## 2018-11-17 DIAGNOSIS — K222 Esophageal obstruction: Secondary | ICD-10-CM

## 2018-11-17 DIAGNOSIS — Z1211 Encounter for screening for malignant neoplasm of colon: Secondary | ICD-10-CM | POA: Diagnosis not present

## 2018-11-17 DIAGNOSIS — R4702 Dysphasia: Secondary | ICD-10-CM

## 2018-11-17 DIAGNOSIS — K449 Diaphragmatic hernia without obstruction or gangrene: Secondary | ICD-10-CM | POA: Diagnosis not present

## 2018-11-17 DIAGNOSIS — D122 Benign neoplasm of ascending colon: Secondary | ICD-10-CM

## 2018-11-17 MED ORDER — PANTOPRAZOLE SODIUM 40 MG PO TBEC: 40.0000 mg | DELAYED_RELEASE_TABLET | Freq: Every day | ORAL | 11 refills | Status: DC

## 2018-11-17 MED ORDER — SODIUM CHLORIDE 0.9 % IV SOLN: 500.0000 mL | Freq: Once | INTRAVENOUS | Status: DC

## 2018-11-17 NOTE — Progress Notes (Signed)
AM- Temp CW- Vitals

## 2018-11-17 NOTE — Progress Notes (Signed)
Pt's states no medical or surgical changes since previsit or office visit. 

## 2018-11-17 NOTE — Op Note (Signed)
[facility] Patient Name: Harry Levy Procedure Date: 11/17/2018 2:21 PM MRN: [id number] Endoscopist: Docia Chuck. [name] , MD Age: 62 Referring MD:  Date of Birth: Jan 12, 1956 Gender: Male Account #: [id number] Procedure:                Upper GI endoscopy Indications:              Dysphagia, Esophageal reflux Medicines:                Monitored Anesthesia Care Procedure:                Pre-Anesthesia Assessment:                           - Prior to the procedure, a History and Physical                            was performed, and patient medications and                            allergies were reviewed. The patient's tolerance of                            previous anesthesia was also reviewed. The risks                            and benefits of the procedure and the sedation                            options and risks were discussed with the patient.                            All questions were answered, and informed consent                            was obtained. Prior Anticoagulants: The patient has                            taken no previous anticoagulant or antiplatelet                            agents. ASA Grade Assessment: I - A normal, healthy                            patient. After reviewing the risks and benefits,                            the patient was deemed in satisfactory condition to                            undergo the procedure.                           After obtaining informed consent, the endoscope was  passed under direct vision. Throughout the                            procedure, the patient's blood pressure, pulse, and                            oxygen saturations were monitored continuously. The                            Endoscope was introduced through the mouth, and                            advanced to the third part of duodenum. The upper                            GI endoscopy was accomplished without  difficulty.                            The patient tolerated the procedure well. Scope In: Scope Out: Findings:                 One benign-appearing, intrinsic moderate stenosis                            was found. This stenosis measured 1.5 cm (inner                            diameter).                           The esophagus revealed mild esophagitis at the                            gastroesophageal junction. No Barrett's.                           The stomach was normal with a 2 to 3 cm sliding                            hiatal hernia.                           The examined duodenum was normal to the third                            portion including the ampulla of Vater.                           The cardia and gastric fundus were normal on                            retroflexion. Complications:            No immediate complications. Estimated Blood Loss:     Estimated blood loss: none. Impression:               1.  GERD with mild esophagitis and large caliber                            stricture                           2. Small hiatal hernia                           3. Otherwise normal exam. Recommendation:           - Patient has a contact number available for                            emergencies. The signs and symptoms of potential                            delayed complications were discussed with the                            patient. Return to normal activities tomorrow.                            Written discharge instructions were provided to the                            patient.                           - Resume previous diet.                           - Continue present medications.                           - Reflux precautions                           - Recommend taking proton pump inhibitor therapy                            daily given accelerated frequency of symptoms in                            recent years and the presence of both esophagitis                             and stricturing on endoscopy today.                           - Consider esophageal dilation for persistent or                            worsening dysphasia despite PPI therapy John N. [name], MD 11/17/2018 3:15:36 PM This report has been signed electronically.

## 2018-11-17 NOTE — Op Note (Signed)
[place] Patient Name: Harry Levy Procedure Date: 11/17/2018 2:22 PM MRN: [id number] Endoscopist: Docia Chuck. [name] , MD Age: 63 Referring MD:  Date of Birth: 01/16/1956 Gender: Male Account #: [id number] Procedure:                Colonoscopy with cold snare polypectomy x 1 Indications:              Screening for colorectal malignant neoplasm. Index                            examination September 2009 was negative for                            neoplasia Medicines:                Monitored Anesthesia Care Procedure:                Pre-Anesthesia Assessment:                           - Prior to the procedure, a History and Physical                            was performed, and patient medications and                            allergies were reviewed. The patient's tolerance of                            previous anesthesia was also reviewed. The risks                            and benefits of the procedure and the sedation                            options and risks were discussed with the patient.                            All questions were answered, and informed consent                            was obtained. Prior Anticoagulants: The patient has                            taken no previous anticoagulant or antiplatelet                            agents. ASA Grade Assessment: I - A normal, healthy                            patient. After reviewing the risks and benefits,                            the patient was deemed in satisfactory condition to  undergo the procedure.                           After obtaining informed consent, the colonoscope                            was passed under direct vision. Throughout the                            procedure, the patient's blood pressure, pulse, and                            oxygen saturations were monitored continuously. The                            Colonoscope was introduced through the  anus and                            advanced to the the cecum, identified by                            appendiceal orifice and ileocecal valve. The                            terminal ileum, ileocecal valve, appendiceal                            orifice, and rectum were photographed. The quality                            of the bowel preparation was excellent. The                            colonoscopy was performed without difficulty. The                            patient tolerated the procedure well. The bowel                            preparation used was SUPREP via split dose                            instruction. Scope In: 2:34:54 PM Scope Out: 2:55:01 PM Scope Withdrawal Time: 0 hours 17 minutes 6 seconds  Total Procedure Duration: 0 hours 20 minutes 7 seconds  Findings:                 **RECTAL: Normal rectal examination including                            prostate                           The terminal ileum appeared normal.  A 3 mm polyp was found in the proximal ascending                            colon. The polyp was sessile. The polyp was removed                            with a cold snare. Resection and retrieval were                            complete.                           The exam was otherwise without abnormality on                            direct and retroflexion views. Small internal                            hemorrhoids noted Complications:            No immediate complications. Estimated blood loss:                            None. Estimated Blood Loss:     Estimated blood loss: none. Impression:               - The examined portion of the ileum was normal.                           - One 3 mm polyp in the proximal ascending colon,                            removed with a cold snare. Resected and retrieved.                           - The examination was otherwise normal on direct                            and  retroflexion views. Recommendation:           - Repeat colonoscopy in 10 years for surveillance.                           - Patient has a contact number available for                            emergencies. The signs and symptoms of potential                            delayed complications were discussed with the                            patient. Return to normal activities tomorrow.  Written discharge instructions were provided to the                            patient.                           - Resume previous diet.                           - Continue present medications.                           - Await pathology results. Docia Chuck. [name], MD 11/17/2018 3:07:58 PM This report has been signed electronically.

## 2018-11-17 NOTE — Progress Notes (Signed)
PT taken to PACU. Monitors in place. VSS. Report given to RN. 

## 2018-11-17 NOTE — Patient Instructions (Signed)
Please read handouts provided. Reflux precautions. Continue present medications. Await pathology results.      YOU HAD AN ENDOSCOPIC PROCEDURE TODAY AT [facility]:   Refer to the procedure report that was given to you for any specific questions about what was found during the examination.  If the procedure report does not answer your questions, please call your gastroenterologist to clarify.  If you requested that your care partner not be given the details of your procedure findings, then the procedure report has been included in a sealed envelope for you to review at your convenience later.  YOU SHOULD EXPECT: Some feelings of bloating in the abdomen. Passage of more gas than usual.  Walking can help get rid of the air that was put into your GI tract during the procedure and reduce the bloating. If you had a lower endoscopy (such as a colonoscopy or flexible sigmoidoscopy) you may notice spotting of blood in your stool or on the toilet paper. If you underwent a bowel prep for your procedure, you may not have a normal bowel movement for a few days.  Please Note:  You might notice some irritation and congestion in your nose or some drainage.  This is from the oxygen used during your procedure.  There is no need for concern and it should clear up in a day or so.  SYMPTOMS TO REPORT IMMEDIATELY:   Following lower endoscopy (colonoscopy or flexible sigmoidoscopy):  Excessive amounts of blood in the stool  Significant tenderness or worsening of abdominal pains  Swelling of the abdomen that is new, acute  Fever of 100F or higher   Following upper endoscopy (EGD)  Vomiting of blood or coffee ground material  New chest pain or pain under the shoulder blades  Painful or persistently difficult swallowing  New shortness of breath  Fever of 100F or higher  Black, tarry-looking stools  For urgent or emergent issues, a gastroenterologist can be reached at any hour by calling  [phone].   DIET:  We do recommend a small meal at first, but then you may proceed to your regular diet.  Drink plenty of fluids but you should avoid alcoholic beverages for 24 hours.  ACTIVITY:  You should plan to take it easy for the rest of today and you should NOT DRIVE or use heavy machinery until tomorrow (because of the sedation medicines used during the test).    FOLLOW UP: Our staff will call the number listed on your records 48-72 hours following your procedure to check on you and address any questions or concerns that you may have regarding the information given to you following your procedure. If we do not reach you, we will leave a message.  We will attempt to reach you two times.  During this call, we will ask if you have developed any symptoms of COVID 19. If you develop any symptoms (ie: fever, flu-like symptoms, shortness of breath, cough etc.) before then, please call [phone].  If you test positive for Covid 19 in the 2 weeks post procedure, please call and report this information to [place].    If any biopsies were taken you will be contacted by phone or by letter within the next 1-3 weeks.  Please call us at [phone] if you have not heard about the biopsies in 3 weeks.    SIGNATURES/CONFIDENTIALITY: You and/or your care partner have signed paperwork which will be entered into your electronic medical record.  These signatures attest to  the fact that that the information above on your After Visit Summary has been reviewed and is understood.  Full responsibility of the confidentiality of this discharge information lies with you and/or your care-partner.

## 2018-11-17 NOTE — Progress Notes (Signed)
Called to room to assist during endoscopic procedure.  Patient ID and intended procedure confirmed with present staff. Received instructions for my participation in the procedure from the performing physician.  

## 2018-11-19 ENCOUNTER — Telehealth: Payer: Self-pay

## 2018-11-19 NOTE — Telephone Encounter (Signed)
  Follow up Call-  Call back number 11/17/2018  Post procedure Call Back phone  # [phone]  Permission to leave phone message Yes  Some recent data might be hidden     Patient questions:  Do you have a fever, pain , or abdominal swelling? No. Pain Score  0 *  Have you tolerated food without any problems? Yes.    Have you been able to return to your normal activities? Yes.    Do you have any questions about your discharge instructions: Diet   No. Medications  No. Follow up visit  No.  Do you have questions or concerns about your Care? No.  Actions: * If pain score is 4 or above: No action needed, pain <4. 1. Have you developed a fever since your procedure? no  2.   Have you had an respiratory symptoms (SOB or cough) since your procedure? no  3.   Have you tested positive for COVID 19 since your procedure no  4.   Have you had any family members/close contacts diagnosed with the COVID 19 since your procedure?  no   If yes to any of these questions please route to Joylene John, RN and Alphonsa Gin, Therapist, sports.

## 2018-11-20 ENCOUNTER — Encounter: Payer: Self-pay | Admitting: Internal Medicine

## 2019-01-11 ENCOUNTER — Other Ambulatory Visit: Payer: Self-pay

## 2019-01-11 MED ORDER — HYOSCYAMINE SULFATE 0.125 MG SL SUBL: SUBLINGUAL_TABLET | SUBLINGUAL | 6 refills | Status: DC

## 2019-01-11 MED ORDER — PANTOPRAZOLE SODIUM 40 MG PO TBEC: 40.0000 mg | DELAYED_RELEASE_TABLET | Freq: Every day | ORAL | 3 refills | Status: DC

## 2019-02-11 ENCOUNTER — Other Ambulatory Visit: Payer: Self-pay | Admitting: Internal Medicine

## 2019-02-11 NOTE — Telephone Encounter (Signed)
[place] Controlled Database Checked Last filled: 09/22/18 # 120 LOV w/you: 04/09/18 Next appt w/you: None

## 2019-03-17 ENCOUNTER — Other Ambulatory Visit: Payer: Self-pay | Admitting: Internal Medicine

## 2019-04-15 ENCOUNTER — Other Ambulatory Visit: Payer: Self-pay | Admitting: Internal Medicine

## 2020-01-17 ENCOUNTER — Other Ambulatory Visit: Payer: Self-pay

## 2020-01-17 MED ORDER — PANTOPRAZOLE SODIUM 40 MG PO TBEC: 40.0000 mg | DELAYED_RELEASE_TABLET | Freq: Every day | ORAL | 3 refills | Status: DC

## 2020-01-24 ENCOUNTER — Other Ambulatory Visit: Payer: Self-pay | Admitting: Internal Medicine

## 2020-01-27 ENCOUNTER — Ambulatory Visit (INDEPENDENT_AMBULATORY_CARE_PROVIDER_SITE_OTHER): Payer: 59 | Admitting: Internal Medicine

## 2020-01-27 ENCOUNTER — Other Ambulatory Visit: Payer: Self-pay

## 2020-01-27 ENCOUNTER — Encounter: Payer: Self-pay | Admitting: Internal Medicine

## 2020-01-27 VITALS — BP 132/84 | HR 72 | Temp 97.9°F | Ht 77.0 in | Wt 222.0 lb

## 2020-01-27 DIAGNOSIS — Z0000 Encounter for general adult medical examination without abnormal findings: Secondary | ICD-10-CM

## 2020-01-27 DIAGNOSIS — F5101 Primary insomnia: Secondary | ICD-10-CM | POA: Diagnosis not present

## 2020-01-27 DIAGNOSIS — E291 Testicular hypofunction: Secondary | ICD-10-CM

## 2020-01-27 DIAGNOSIS — E785 Hyperlipidemia, unspecified: Secondary | ICD-10-CM

## 2020-01-27 DIAGNOSIS — K219 Gastro-esophageal reflux disease without esophagitis: Secondary | ICD-10-CM

## 2020-01-27 NOTE — Assessment & Plan Note (Addendum)
Doing better.  Current medical pain being.  Use weighted blanket, Valerian root

## 2020-01-27 NOTE — Patient Instructions (Addendum)
These suggestions will probably help you to improve your metabolism if you are not overweight and to lose weight if you are overweight: 1.  Reduce your consumption of sugars and starches.  Eliminate high fructose corn syrup from your diet.  Reduce your consumption of processed foods.  For desserts try to have seasonal fruits, berries, nuts, cheeses or dark chocolate with more than 70% cacao. 2.  Do not snack 3.  You do not have to eat breakfast.  If you choose to have breakfast - eat plain greek yogurt, eggs, oatmeal (without sugar) - use honey if you need to. 4.  Drink water, freshly brewed unsweetened tea (green, black or herbal) or coffee.  Do not drink sodas including diet sodas , juices, beverages sweetened with artificial sweeteners. 5.  Reduce your consumption of refined grains. 6.  Avoid protein drinks such as Optifast, Slim fast etc. Eat chicken, fish, meat, dairy and beans for your sources of protein. 7.  Natural unprocessed fats like cold pressed virgin olive oil, butter, coconut oil are good for you.  Eat avocados. 8.  Increase your consumption of fiber.  Fruits, berries, vegetables, whole grains, flaxseed, chia seeds, beans, popcorn, nuts, oatmeal are good sources of fiber 9.  Use vinegar in your diet, i.e. apple cider vinegar, red wine or balsamic vinegar 10.  You can try fasting.  For example you can skip breakfast and lunch every other day (24-hour fast) 11.  Stress reduction, good night sleep, relaxation, meditation, yoga and other physical activity is likely to help you to maintain low weight too. 12.  If you drink alcohol, limit your alcohol intake to no more than 2 drinks a day.   Weighted blanket, Valerian root   You can try Lion's Mane Mushroom extract or capsules for memory, focus    Cardiac CT calcium scoring test $150 Tel # is [phone]   Computed tomography, more commonly known as a CT or CAT scan, is a diagnostic medical imaging test. Like traditional x-rays,  it produces multiple images or pictures of the inside of the body. The cross-sectional images generated during a CT scan can be reformatted in multiple planes. They can even generate three-dimensional images. These images can be viewed on a computer monitor, printed on film or by a 3D printer, or transferred to a CD or DVD. CT images of internal organs, bones, soft tissue and blood vessels provide greater detail than traditional x-rays, particularly of soft tissues and blood vessels. A cardiac CT scan for coronary calcium is a non-invasive way of obtaining information about the presence, location and extent of calcified plaque in the coronary arteries--the vessels that supply oxygen-containing blood to the heart muscle. Calcified plaque results when there is a build-up of fat and other substances under the inner layer of the artery. This material can calcify which signals the presence of atherosclerosis, a disease of the vessel wall, also called coronary artery disease (CAD). People with this disease have an increased risk for heart attacks. In addition, over time, progression of plaque build up (CAD) can narrow the arteries or even close off blood flow to the heart. The result may be chest pain, sometimes called "angina," or a heart attack. Because calcium is a marker of CAD, the amount of calcium detected on a cardiac CT scan is a helpful prognostic tool. The findings on cardiac CT are expressed as a calcium score. Another name for this test is coronary artery calcium scoring.  What are some common uses of the  procedure? The goal of cardiac CT scan for calcium scoring is to determine if CAD is present and to what extent, even if there are no symptoms. It is a screening study that may be recommended by a physician for patients with risk factors for CAD but no clinical symptoms. The major risk factors for CAD are: . high blood cholesterol levels  . family history of heart attacks  . diabetes  . high  blood pressure  . cigarette smoking  . overweight or obese  . physical inactivity   A negative cardiac CT scan for calcium scoring shows no calcification within the coronary arteries. This suggests that CAD is absent or so minimal it cannot be seen by this technique. The chance of having a heart attack over the next two to five years is very low under these circumstances. A positive test means that CAD is present, regardless of whether or not the patient is experiencing any symptoms. The amount of calcification--expressed as the calcium score--may help to predict the likelihood of a myocardial infarction (heart attack) in the coming years and helps your medical doctor or cardiologist decide whether the patient may need to take preventive medicine or undertake other measures such as diet and exercise to lower the risk for heart attack. The extent of CAD is graded according to your calcium score:  Calcium Score  Presence of CAD (coronary artery disease)  0 No evidence of CAD   1-10 Minimal evidence of CAD  11-100 Mild evidence of CAD  101-400 Moderate evidence of CAD  Over 400 Extensive evidence of CAD

## 2020-01-27 NOTE — Progress Notes (Signed)
Subjective:  Patient ID: Harry Levy, male    DOB: 1955/05/18  Age: 64 y.o. MRN: [id number]  CC: Annual Exam   HPI Harry Levy presents for a well exam  Outpatient Medications Prior to Visit  Medication Sig Dispense Refill  . aspirin EC 81 MG tablet Take 81 mg by mouth daily.    . Cholecalciferol (VITAMIN D3) 2000 units capsule Take 1 capsule (2,000 Units total) by mouth daily. 100 capsule 3  . Coenzyme Q10 (COQ10) 100 MG CAPS Take 1 capsule by mouth daily.    . cyclobenzaprine (FLEXERIL) 10 MG tablet Take 1 tablet (10 mg total) by mouth 3 (three) times daily as needed. Overdue for Annual appt due must see provider for future refills 90 tablet 0  . famotidine (PEPCID) 40 MG tablet Take 1 tablet (40 mg total) by mouth daily. 90 tablet 3  . gabapentin (NEURONTIN) 300 MG capsule TAKE 1 CAPSULE (300 MG TOTAL) BY MOUTH 3 (THREE) TIMES DAILY. 270 capsule 3  . hyoscyamine (LEVSIN SL) 0.125 MG SL tablet Take 1-2 by mouth 4 times a day prn 30 tablet 6  . ibuprofen (ADVIL,MOTRIN) 600 MG tablet Take 600 mg by mouth 2 (two) times daily.    . Multiple Vitamin (MULTIVITAMIN) tablet Take 1 tablet by mouth daily.    . Na Sulfate-K Sulfate-Mg Sulf 17.5-3.13-1.6 GM/177ML SOLN Take as directed per procedure instructions 354 mL 0  . Omega-3 Fatty Acids (FISH OIL) 1000 MG CAPS Take 1 capsule by mouth daily.    . pantoprazole (PROTONIX) 40 MG tablet Take 1 tablet (40 mg total) by mouth daily. 90 tablet 3  . testosterone cypionate (DEPOTESTOSTERONE CYPIONATE) 200 MG/ML injection every 14 (fourteen) days.   0  . traMADol (ULTRAM) 50 MG tablet TAKE 1 TABLET (50 MG TOTAL) BY MOUTH EVERY 6 (SIX) HOURS AS NEEDED. 120 tablet 1  . vitamin C (ASCORBIC ACID) 500 MG tablet Take 500 mg by mouth daily.    . tadalafil (CIALIS) 20 MG tablet Take 1 tablet (20 mg total) by mouth daily as needed for erectile dysfunction. 30 tablet 11  . zolpidem (AMBIEN CR) 12.5 MG CR tablet TAKE 1 TABLET (12.5 MG TOTAL) BY MOUTH AT  BEDTIME AS NEEDED FOR SLEEP. (Patient not taking: Reported on 01/27/2020) 90 tablet 1   No facility-administered medications prior to visit.    ROS: Review of Systems  Constitutional: Negative for appetite change, fatigue and unexpected weight change.  HENT: Negative for congestion, nosebleeds, sneezing, sore throat and trouble swallowing.   Eyes: Negative for itching and visual disturbance.  Respiratory: Negative for cough.   Cardiovascular: Negative for chest pain, palpitations and leg swelling.  Gastrointestinal: Negative for abdominal distention, blood in stool, diarrhea and nausea.  Genitourinary: Negative for frequency and hematuria.  Musculoskeletal: Positive for back pain. Negative for gait problem, joint swelling and neck pain.  Skin: Negative for rash.  Neurological: Negative for dizziness, tremors, speech difficulty and weakness.  Psychiatric/Behavioral: Positive for sleep disturbance. Negative for agitation, dysphoric mood and suicidal ideas. The patient is not nervous/anxious.     Objective:  BP 132/84 (BP Location: Left Arm)   Pulse 72   Temp 97.9 F (36.6 C) (Oral)   Ht 6\' 5"  (1.956 m)   Wt 222 lb (100.7 kg)   SpO2 97%   BMI 26.33 kg/m   BP Readings from Last 3 Encounters:  01/27/20 132/84  11/17/18 106/68  04/09/18 124/76    Wt Readings from Last 3 Encounters:  01/27/20 222 lb (100.7 kg)  11/17/18 211 lb (95.7 kg)  04/09/18 211 lb (95.7 kg)    Physical Exam Constitutional:      General: He is not in acute distress.    Appearance: He is well-developed.     Comments: NAD  Eyes:     Conjunctiva/sclera: Conjunctivae normal.     Pupils: Pupils are equal, round, and reactive to light.  Neck:     Thyroid: No thyromegaly.     Vascular: No JVD.  Cardiovascular:     Rate and Rhythm: Normal rate and regular rhythm.     Heart sounds: Normal heart sounds. No murmur heard.  No friction rub. No gallop.   Pulmonary:     Effort: Pulmonary effort is normal. No  respiratory distress.     Breath sounds: Normal breath sounds. No wheezing or rales.  Chest:     Chest wall: No tenderness.  Abdominal:     General: Bowel sounds are normal. There is no distension.     Palpations: Abdomen is soft. There is no mass.     Tenderness: There is no abdominal tenderness. There is no guarding or rebound.  Musculoskeletal:        General: No tenderness. Normal range of motion.     Cervical back: Normal range of motion.  Lymphadenopathy:     Cervical: No cervical adenopathy.  Skin:    General: Skin is warm and dry.     Findings: No rash.  Neurological:     Mental Status: He is alert and oriented to person, place, and time.     Cranial Nerves: No cranial nerve deficit.     Motor: No abnormal muscle tone.     Coordination: Coordination normal.     Gait: Gait normal.     Deep Tendon Reflexes: Reflexes are normal and symmetric.  Psychiatric:        Behavior: Behavior normal.        Thought Content: Thought content normal.        Judgment: Judgment normal.   Prostate/rectal exam per urology  Lab Results  Component Value Date   WBC 5.2 04/10/2018   HGB 16.8 04/10/2018   HCT 49.1 04/10/2018   PLT 199.0 04/10/2018   GLUCOSE 106 (H) 04/10/2018   CHOL 182 04/10/2018   TRIG 52.0 04/10/2018   HDL 53.70 04/10/2018   LDLCALC 118 (H) 04/10/2018   ALT 24 04/10/2018   AST 18 04/10/2018   NA 137 04/10/2018   K 4.3 04/10/2018   CL 103 04/10/2018   CREATININE 1.16 04/10/2018   BUN 24 (H) 04/10/2018   CO2 26 04/10/2018   TSH 1.53 04/10/2018    DG INJECT DIAG/THERA/INC NEEDLE/CATH/PLC EPI/LUMB/SAC W/IMG  Result Date: 12/11/2015 CLINICAL DATA:  Lumbosacral spondylosis. Left lower extremity L5 and S1 radiculitis. 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 FLUOROSCOPY TIME:  Radiation Exposure Index (as provided by the fluoroscopic device): 24.40 uGy*m2 If the device does not provide the exposure index:Fluoroscopy Time: 18 seconds Number of Acquired  Images:  0 PROCEDURE: The procedure, risks, benefits, and alternatives were explained to the patient. Questions regarding the procedure were encouraged and answered. The patient understands and consents to the procedure. LUMBAR EPIDURAL INJECTION: An interlaminar approach was performed on left at L4-5. The overlying skin was cleansed and anesthetized. A 20 gauge epidural needle was advanced using loss-of-resistance technique. DIAGNOSTIC EPIDURAL INJECTION: Injection of Isovue-M 200 shows a good epidural pattern with spread above and below the level  of needle placement, primarily on the left no vascular opacification is seen. THERAPEUTIC EPIDURAL INJECTION: 120 Mg of Depo-Medrol mixed with 3 mL 1% lidocaine were instilled. The procedure was well-tolerated, and the patient was discharged thirty minutes following the injection in good condition. COMPLICATIONS: None IMPRESSION: Technically successful epidural injection on the left L4-5 # 1 Electronically Signed   By: San Morelle M.D.   On: 12/11/2015 15:56    Assessment & Plan:    Walker Kehr, MD

## 2020-01-31 NOTE — Assessment & Plan Note (Signed)
On testosterone.  Follow-up with Dr. Diona Fanti.  Labs

## 2020-02-01 NOTE — Assessment & Plan Note (Signed)
°  We discussed age appropriate health related issues, including available/recomended screening tests and vaccinations. Labs were ordered to be later reviewed . All questions were answered. We discussed one or more of the following - seat belt use, use of sunscreen/sun exposure exercise, safe sex, fall risk reduction, second hand smoke exposure, firearm use and storage, seat belt use, a need for adhering to healthy diet and exercise. Labs were ordered.  All questions were answered. Colon 2009 and 2020 Dr [name], due 2030  cardiac CT scan for calcium scoring  ordered

## 2020-02-01 NOTE — Assessment & Plan Note (Signed)
Pepcid Status post EGD-September 2020

## 2020-02-21 ENCOUNTER — Other Ambulatory Visit: Payer: Self-pay | Admitting: Internal Medicine

## 2020-02-21 ENCOUNTER — Other Ambulatory Visit: Payer: Self-pay

## 2020-02-21 ENCOUNTER — Ambulatory Visit (INDEPENDENT_AMBULATORY_CARE_PROVIDER_SITE_OTHER)
Admission: RE | Admit: 2020-02-21 | Discharge: 2020-02-21 | Disposition: A | Discharge status: Home / Self Care | Payer: Self-pay | Source: Ambulatory Visit | Attending: Internal Medicine | Admitting: Internal Medicine

## 2020-02-21 DIAGNOSIS — E785 Hyperlipidemia, unspecified: Secondary | ICD-10-CM

## 2020-02-23 ENCOUNTER — Other Ambulatory Visit (HOSPITAL_COMMUNITY): Payer: Self-pay | Admitting: Urology

## 2020-04-06 ENCOUNTER — Other Ambulatory Visit (HOSPITAL_COMMUNITY): Payer: Self-pay | Admitting: Dermatology

## 2020-04-06 DIAGNOSIS — D1801 Hemangioma of skin and subcutaneous tissue: Secondary | ICD-10-CM | POA: Diagnosis not present

## 2020-04-06 DIAGNOSIS — D2271 Melanocytic nevi of right lower limb, including hip: Secondary | ICD-10-CM | POA: Diagnosis not present

## 2020-04-06 DIAGNOSIS — D2272 Melanocytic nevi of left lower limb, including hip: Secondary | ICD-10-CM | POA: Diagnosis not present

## 2020-04-06 DIAGNOSIS — D2261 Melanocytic nevi of right upper limb, including shoulder: Secondary | ICD-10-CM | POA: Diagnosis not present

## 2020-04-06 DIAGNOSIS — Z85828 Personal history of other malignant neoplasm of skin: Secondary | ICD-10-CM | POA: Diagnosis not present

## 2020-04-06 DIAGNOSIS — L821 Other seborrheic keratosis: Secondary | ICD-10-CM | POA: Diagnosis not present

## 2020-04-06 DIAGNOSIS — D225 Melanocytic nevi of trunk: Secondary | ICD-10-CM | POA: Diagnosis not present

## 2020-04-06 DIAGNOSIS — L814 Other melanin hyperpigmentation: Secondary | ICD-10-CM | POA: Diagnosis not present

## 2020-04-06 DIAGNOSIS — D2262 Melanocytic nevi of left upper limb, including shoulder: Secondary | ICD-10-CM | POA: Diagnosis not present

## 2020-04-26 ENCOUNTER — Other Ambulatory Visit: Payer: Self-pay | Admitting: Internal Medicine

## 2020-05-11 ENCOUNTER — Other Ambulatory Visit (INDEPENDENT_AMBULATORY_CARE_PROVIDER_SITE_OTHER): Payer: 59

## 2020-05-11 ENCOUNTER — Other Ambulatory Visit: Payer: Self-pay | Admitting: Internal Medicine

## 2020-05-11 DIAGNOSIS — Z0000 Encounter for general adult medical examination without abnormal findings: Secondary | ICD-10-CM

## 2020-05-11 DIAGNOSIS — R7309 Other abnormal glucose: Secondary | ICD-10-CM

## 2020-05-11 DIAGNOSIS — N320 Bladder-neck obstruction: Secondary | ICD-10-CM

## 2020-05-11 LAB — CBC WITH DIFFERENTIAL/PLATELET
Basophils Absolute: 0.1 10*3/uL (ref 0.0–0.1)
Basophils Relative: 1.4 % (ref 0.0–3.0)
Eosinophils Absolute: 0.3 10*3/uL (ref 0.0–0.7)
Eosinophils Relative: 4.5 % (ref 0.0–5.0)
HCT: 48.5 % (ref 39.0–52.0)
Hemoglobin: 16.6 g/dL (ref 13.0–17.0)
Lymphocytes Relative: 28.9 % (ref 12.0–46.0)
Lymphs Abs: 2.1 10*3/uL (ref 0.7–4.0)
MCHC: 34.2 g/dL (ref 30.0–36.0)
MCV: 91.9 fl (ref 78.0–100.0)
Monocytes Absolute: 0.5 10*3/uL (ref 0.1–1.0)
Monocytes Relative: 7.2 % (ref 3.0–12.0)
Neutro Abs: 4.3 10*3/uL (ref 1.4–7.7)
Neutrophils Relative %: 58 % (ref 43.0–77.0)
Platelets: 204 10*3/uL (ref 150.0–400.0)
RBC: 5.28 Mil/uL (ref 4.22–5.81)
RDW: 13.3 % (ref 11.5–15.5)
WBC: 7.4 10*3/uL (ref 4.0–10.5)

## 2020-05-11 LAB — COMPREHENSIVE METABOLIC PANEL
ALT: 24 U/L (ref 0–53)
AST: 19 U/L (ref 0–37)
Albumin: 4 g/dL (ref 3.5–5.2)
Alkaline Phosphatase: 37 U/L — ABNORMAL LOW (ref 39–117)
BUN: 17 mg/dL (ref 6–23)
CO2: 28 mEq/L (ref 19–32)
Calcium: 8.9 mg/dL (ref 8.4–10.5)
Chloride: 105 mEq/L (ref 96–112)
Creatinine, Ser: 1.12 mg/dL (ref 0.40–1.50)
GFR: 70.43 mL/min (ref >60.00)
Glucose, Bld: 108 mg/dL — ABNORMAL HIGH (ref 70–99)
Potassium: 4.1 mEq/L (ref 3.5–5.1)
Sodium: 139 mEq/L (ref 135–145)
Total Bilirubin: 0.6 mg/dL (ref 0.2–1.2)
Total Protein: 6.6 g/dL (ref 6.0–8.3)

## 2020-05-11 LAB — URINALYSIS
Bilirubin Urine: NEGATIVE
Hgb urine dipstick: NEGATIVE
Leukocytes,Ua: NEGATIVE
Nitrite: NEGATIVE
Specific Gravity, Urine: 1.025 (ref 1.000–1.030)
Total Protein, Urine: NEGATIVE
Urine Glucose: NEGATIVE
Urobilinogen, UA: 0.2 (ref 0.0–1.0)
pH: 6 (ref 5.0–8.0)

## 2020-05-11 LAB — LIPID PANEL
Cholesterol: 180 mg/dL (ref 0–200)
HDL: 54.1 mg/dL (ref >39.00)
LDL Cholesterol: 118 mg/dL — ABNORMAL HIGH (ref 0–99)
NonHDL: 126.39
Total CHOL/HDL Ratio: 3
Triglycerides: 42 mg/dL (ref 0.0–149.0)
VLDL: 8.4 mg/dL (ref 0.0–40.0)

## 2020-05-11 LAB — PSA: PSA: 1.58 ng/mL (ref 0.10–4.00)

## 2020-05-11 LAB — TSH: TSH: 1.27 u[IU]/mL (ref 0.35–4.50)

## 2020-05-12 ENCOUNTER — Other Ambulatory Visit (HOSPITAL_COMMUNITY): Payer: Self-pay | Admitting: Urology

## 2020-05-12 DIAGNOSIS — E291 Testicular hypofunction: Secondary | ICD-10-CM | POA: Diagnosis not present

## 2020-05-12 DIAGNOSIS — N5201 Erectile dysfunction due to arterial insufficiency: Secondary | ICD-10-CM | POA: Diagnosis not present

## 2020-05-12 DIAGNOSIS — N400 Enlarged prostate without lower urinary tract symptoms: Secondary | ICD-10-CM | POA: Diagnosis not present

## 2020-05-12 LAB — HEMOGLOBIN A1C: Hgb A1c MFr Bld: 5.6 % (ref 4.6–6.5)

## 2020-05-17 ENCOUNTER — Other Ambulatory Visit (HOSPITAL_COMMUNITY): Payer: Self-pay | Admitting: Urology

## 2020-06-19 DIAGNOSIS — E291 Testicular hypofunction: Secondary | ICD-10-CM | POA: Diagnosis not present

## 2020-06-30 ENCOUNTER — Other Ambulatory Visit (HOSPITAL_COMMUNITY): Payer: Self-pay

## 2020-06-30 MED ORDER — ANASTROZOLE 1 MG PO TABS
1.0000 mg | ORAL_TABLET | Freq: Every day | ORAL | 3 refills | Status: DC
  Filled 2020-06-30: qty 90, 90d supply, fill #0
  Filled 2020-09-26: qty 90, 90d supply, fill #1
  Filled 2020-12-31: qty 90, 90d supply, fill #2
  Filled 2021-04-02: qty 90, 90d supply, fill #3

## 2020-07-06 DIAGNOSIS — H5213 Myopia, bilateral: Secondary | ICD-10-CM | POA: Diagnosis not present

## 2020-07-06 DIAGNOSIS — H524 Presbyopia: Secondary | ICD-10-CM | POA: Diagnosis not present

## 2020-07-07 ENCOUNTER — Other Ambulatory Visit (HOSPITAL_COMMUNITY): Payer: Self-pay

## 2020-07-14 ENCOUNTER — Other Ambulatory Visit (HOSPITAL_COMMUNITY): Payer: Self-pay

## 2020-07-14 MED ORDER — "SYRINGE/NEEDLE (DISP) 25G X 1-1/2"" 3 ML MISC"
3 refills | Status: DC
  Filled 2020-07-14: qty 20, 20d supply, fill #0

## 2020-07-14 MED ORDER — "SYRINGE/NEEDLE (DISP) 25G X 1"" 3 ML MISC"
3 refills | Status: DC
  Filled 2020-07-14: qty 20, 20d supply, fill #0
  Filled 2021-04-30: qty 12, 12d supply, fill #1

## 2020-07-14 MED FILL — Pantoprazole Sodium EC Tab 40 MG (Base Equiv): ORAL | 90 days supply | Qty: 90 | Fill #0 | Status: AC

## 2020-07-14 MED FILL — Gabapentin Cap 300 MG: ORAL | 90 days supply | Qty: 270 | Fill #0 | Status: AC

## 2020-07-15 ENCOUNTER — Other Ambulatory Visit (HOSPITAL_COMMUNITY): Payer: Self-pay

## 2020-07-30 MED FILL — Testosterone Cypionate IM Inj in Oil 200 MG/ML: INTRAMUSCULAR | 84 days supply | Qty: 6 | Fill #0 | Status: AC

## 2020-07-31 ENCOUNTER — Other Ambulatory Visit (HOSPITAL_COMMUNITY): Payer: Self-pay

## 2020-08-01 ENCOUNTER — Other Ambulatory Visit (HOSPITAL_COMMUNITY): Payer: Self-pay

## 2020-08-22 ENCOUNTER — Other Ambulatory Visit (HOSPITAL_COMMUNITY): Payer: Self-pay

## 2020-08-22 MED ORDER — AMOXICILLIN 875 MG PO TABS
875.0000 mg | ORAL_TABLET | Freq: Two times a day (BID) | ORAL | 0 refills | Status: DC
  Filled 2020-08-22: qty 20, 10d supply, fill #0

## 2020-08-22 MED ORDER — IBUPROFEN 800 MG PO TABS
800.0000 mg | ORAL_TABLET | Freq: Four times a day (QID) | ORAL | 0 refills | Status: DC | PRN
  Filled 2020-08-22: qty 24, 6d supply, fill #0

## 2020-08-22 MED ORDER — ACETAMINOPHEN-CODEINE 300-30 MG PO TABS
ORAL_TABLET | ORAL | 0 refills | Status: DC
  Filled 2020-08-22: qty 16, 4d supply, fill #0

## 2020-08-28 ENCOUNTER — Other Ambulatory Visit (HOSPITAL_COMMUNITY): Payer: Self-pay

## 2020-09-26 ENCOUNTER — Other Ambulatory Visit (HOSPITAL_COMMUNITY): Payer: Self-pay

## 2020-10-02 ENCOUNTER — Other Ambulatory Visit (HOSPITAL_COMMUNITY): Payer: Self-pay

## 2020-10-13 MED FILL — Pantoprazole Sodium EC Tab 40 MG (Base Equiv): ORAL | 90 days supply | Qty: 90 | Fill #1 | Status: AC

## 2020-10-14 ENCOUNTER — Other Ambulatory Visit (HOSPITAL_COMMUNITY): Payer: Self-pay

## 2020-10-26 ENCOUNTER — Other Ambulatory Visit: Payer: Self-pay | Admitting: Urology

## 2020-10-28 ENCOUNTER — Other Ambulatory Visit (HOSPITAL_COMMUNITY): Payer: Self-pay

## 2020-10-28 MED FILL — Testosterone Cypionate IM Inj in Oil 200 MG/ML: INTRAMUSCULAR | 84 days supply | Qty: 6 | Fill #0 | Status: AC

## 2020-12-07 ENCOUNTER — Other Ambulatory Visit: Payer: Self-pay | Admitting: Internal Medicine

## 2020-12-07 MED FILL — Gabapentin Cap 300 MG: ORAL | 90 days supply | Qty: 270 | Fill #1 | Status: AC

## 2020-12-08 ENCOUNTER — Other Ambulatory Visit (HOSPITAL_COMMUNITY): Payer: Self-pay

## 2020-12-08 MED ORDER — CYCLOBENZAPRINE HCL 10 MG PO TABS
ORAL_TABLET | ORAL | 0 refills | Status: DC
  Filled 2020-12-08: qty 90, 30d supply, fill #0

## 2020-12-22 ENCOUNTER — Ambulatory Visit: Payer: 59 | Attending: Internal Medicine

## 2020-12-22 DIAGNOSIS — Z23 Encounter for immunization: Secondary | ICD-10-CM

## 2020-12-22 NOTE — Progress Notes (Signed)
   Covid-19 Vaccination [facility]  Name:  REQUAN HARDGE    MRN: [id number] DOB: Jul 30, 1955  12/22/2020  Mr. Fuller Plan was observed post Covid-19 immunization for 15 minutes without incident. He was provided with Vaccine Information Sheet and instruction to access the V-Safe system.   Mr. Fuller Plan was instructed to call 911 with any severe reactions post vaccine: Difficulty breathing  Swelling of face and throat  A fast heartbeat  A bad rash all over body  Dizziness and weakness

## 2021-01-01 ENCOUNTER — Other Ambulatory Visit (HOSPITAL_COMMUNITY): Payer: Self-pay

## 2021-01-04 ENCOUNTER — Other Ambulatory Visit: Payer: Self-pay | Admitting: Internal Medicine

## 2021-01-04 ENCOUNTER — Other Ambulatory Visit (HOSPITAL_COMMUNITY): Payer: Self-pay

## 2021-01-04 MED ORDER — TIZANIDINE HCL 2 MG PO CAPS
2.0000 mg | ORAL_CAPSULE | Freq: Three times a day (TID) | ORAL | 1 refills | Status: DC | PRN
Start: 2021-01-04 — End: 2023-04-03
  Filled 2021-01-04: qty 180, 30d supply, fill #0
  Filled 2021-11-24: qty 180, 30d supply, fill #1

## 2021-01-05 ENCOUNTER — Other Ambulatory Visit (HOSPITAL_COMMUNITY): Payer: Self-pay

## 2021-01-12 ENCOUNTER — Other Ambulatory Visit (HOSPITAL_BASED_OUTPATIENT_CLINIC_OR_DEPARTMENT_OTHER): Payer: Self-pay

## 2021-01-12 MED ORDER — PFIZER COVID-19 VAC BIVALENT 30 MCG/0.3ML IM SUSP
INTRAMUSCULAR | 0 refills | Status: DC
  Filled 2021-01-12: qty 0.3, 1d supply, fill #0

## 2021-01-18 ENCOUNTER — Other Ambulatory Visit (HOSPITAL_COMMUNITY): Payer: Self-pay

## 2021-01-18 ENCOUNTER — Other Ambulatory Visit: Payer: Self-pay | Admitting: Internal Medicine

## 2021-01-18 MED ORDER — PANTOPRAZOLE SODIUM 40 MG PO TBEC
DELAYED_RELEASE_TABLET | Freq: Every day | ORAL | 3 refills | Status: DC
  Filled 2021-01-18: qty 90, 90d supply, fill #0
  Filled 2021-04-24: qty 90, 90d supply, fill #1
  Filled 2021-07-21: qty 90, 90d supply, fill #2
  Filled 2021-10-15: qty 90, 90d supply, fill #3

## 2021-01-24 ENCOUNTER — Other Ambulatory Visit: Payer: Self-pay | Admitting: Urology

## 2021-01-26 ENCOUNTER — Other Ambulatory Visit (HOSPITAL_COMMUNITY): Payer: Self-pay

## 2021-01-27 ENCOUNTER — Other Ambulatory Visit (HOSPITAL_COMMUNITY): Payer: Self-pay

## 2021-01-27 MED ORDER — TESTOSTERONE CYPIONATE 200 MG/ML IM SOLN
INTRAMUSCULAR | 1 refills | Status: DC
  Filled 2021-01-27: qty 6, 84d supply, fill #0
  Filled 2021-04-27: qty 6, 84d supply, fill #1

## 2021-04-02 ENCOUNTER — Other Ambulatory Visit (HOSPITAL_COMMUNITY): Payer: Self-pay

## 2021-04-02 MED FILL — Gabapentin Cap 300 MG: ORAL | 90 days supply | Qty: 270 | Fill #2 | Status: AC

## 2021-04-06 ENCOUNTER — Other Ambulatory Visit (HOSPITAL_COMMUNITY): Payer: Self-pay

## 2021-04-06 MED FILL — Terbinafine HCl Tab 250 MG: ORAL | 30 days supply | Qty: 7 | Fill #0 | Status: AC

## 2021-04-24 ENCOUNTER — Other Ambulatory Visit (HOSPITAL_COMMUNITY): Payer: Self-pay

## 2021-04-27 ENCOUNTER — Other Ambulatory Visit (HOSPITAL_COMMUNITY): Payer: Self-pay

## 2021-04-30 ENCOUNTER — Other Ambulatory Visit (HOSPITAL_COMMUNITY): Payer: Self-pay

## 2021-04-30 MED FILL — Needle (Disp) 18 x 1-1/2": 90 days supply | Qty: 12 | Fill #0 | Status: AC

## 2021-05-10 ENCOUNTER — Other Ambulatory Visit (HOSPITAL_COMMUNITY): Payer: Self-pay

## 2021-06-14 DIAGNOSIS — L570 Actinic keratosis: Secondary | ICD-10-CM | POA: Diagnosis not present

## 2021-06-14 DIAGNOSIS — B351 Tinea unguium: Secondary | ICD-10-CM | POA: Diagnosis not present

## 2021-06-14 DIAGNOSIS — L723 Sebaceous cyst: Secondary | ICD-10-CM | POA: Diagnosis not present

## 2021-06-14 DIAGNOSIS — Z85828 Personal history of other malignant neoplasm of skin: Secondary | ICD-10-CM | POA: Diagnosis not present

## 2021-06-14 DIAGNOSIS — D225 Melanocytic nevi of trunk: Secondary | ICD-10-CM | POA: Diagnosis not present

## 2021-06-14 DIAGNOSIS — L821 Other seborrheic keratosis: Secondary | ICD-10-CM | POA: Diagnosis not present

## 2021-07-11 ENCOUNTER — Other Ambulatory Visit (HOSPITAL_COMMUNITY): Payer: Self-pay

## 2021-07-13 ENCOUNTER — Other Ambulatory Visit (HOSPITAL_COMMUNITY): Payer: Self-pay

## 2021-07-13 DIAGNOSIS — H5213 Myopia, bilateral: Secondary | ICD-10-CM | POA: Diagnosis not present

## 2021-07-13 MED ORDER — ANASTROZOLE 1 MG PO TABS
ORAL_TABLET | ORAL | 3 refills | Status: DC
  Filled 2021-07-13: qty 90, 90d supply, fill #0
  Filled 2021-10-15: qty 90, 90d supply, fill #1
  Filled 2022-01-19: qty 90, 90d supply, fill #2
  Filled 2022-04-22: qty 90, 90d supply, fill #3

## 2021-07-13 MED ORDER — TESTOSTERONE CYPIONATE 200 MG/ML IM SOLN
INTRAMUSCULAR | 1 refills | Status: DC
  Filled 2021-07-13: qty 6, 84d supply, fill #0
  Filled 2021-10-29: qty 6, 84d supply, fill #1

## 2021-07-13 MED ORDER — ANASTROZOLE 1 MG PO TABS: ORAL_TABLET | ORAL | 3 refills | Status: DC

## 2021-07-20 DIAGNOSIS — R948 Abnormal results of function studies of other organs and systems: Secondary | ICD-10-CM | POA: Diagnosis not present

## 2021-07-20 DIAGNOSIS — N5201 Erectile dysfunction due to arterial insufficiency: Secondary | ICD-10-CM | POA: Diagnosis not present

## 2021-07-20 DIAGNOSIS — E291 Testicular hypofunction: Secondary | ICD-10-CM | POA: Diagnosis not present

## 2021-07-20 DIAGNOSIS — N400 Enlarged prostate without lower urinary tract symptoms: Secondary | ICD-10-CM | POA: Diagnosis not present

## 2021-07-21 ENCOUNTER — Other Ambulatory Visit (HOSPITAL_COMMUNITY): Payer: Self-pay

## 2021-10-15 ENCOUNTER — Other Ambulatory Visit (HOSPITAL_COMMUNITY): Payer: Self-pay

## 2021-10-29 ENCOUNTER — Other Ambulatory Visit: Payer: Self-pay | Admitting: Urology

## 2021-10-30 ENCOUNTER — Other Ambulatory Visit (HOSPITAL_COMMUNITY): Payer: Self-pay

## 2021-10-31 ENCOUNTER — Other Ambulatory Visit (HOSPITAL_COMMUNITY): Payer: Self-pay

## 2021-11-03 ENCOUNTER — Other Ambulatory Visit (HOSPITAL_COMMUNITY): Payer: Self-pay

## 2021-11-05 ENCOUNTER — Other Ambulatory Visit (HOSPITAL_COMMUNITY): Payer: Self-pay

## 2021-11-05 MED ORDER — "SYRINGE 25G X 1"" 3 ML MISC"
3 refills | Status: DC
  Filled 2021-11-05: qty 20, 20d supply, fill #0

## 2021-11-09 ENCOUNTER — Other Ambulatory Visit (HOSPITAL_COMMUNITY): Payer: Self-pay

## 2021-11-26 ENCOUNTER — Other Ambulatory Visit (HOSPITAL_COMMUNITY): Payer: Self-pay

## 2021-11-27 ENCOUNTER — Other Ambulatory Visit (HOSPITAL_COMMUNITY): Payer: Self-pay

## 2021-12-18 ENCOUNTER — Other Ambulatory Visit (HOSPITAL_COMMUNITY): Payer: Self-pay

## 2021-12-25 ENCOUNTER — Other Ambulatory Visit (HOSPITAL_COMMUNITY): Payer: Self-pay

## 2022-01-13 ENCOUNTER — Other Ambulatory Visit: Payer: Self-pay | Admitting: Internal Medicine

## 2022-01-14 ENCOUNTER — Other Ambulatory Visit (HOSPITAL_COMMUNITY): Payer: Self-pay

## 2022-01-14 MED ORDER — PANTOPRAZOLE SODIUM 40 MG PO TBEC
40.0000 mg | DELAYED_RELEASE_TABLET | Freq: Every day | ORAL | 3 refills | Status: DC
  Filled 2022-01-21: qty 90, 90d supply, fill #0
  Filled 2022-04-22: qty 90, 90d supply, fill #1
  Filled 2022-07-22: qty 90, 90d supply, fill #2
  Filled 2022-10-23: qty 90, 90d supply, fill #3

## 2022-01-19 ENCOUNTER — Other Ambulatory Visit (HOSPITAL_COMMUNITY): Payer: Self-pay

## 2022-01-21 ENCOUNTER — Other Ambulatory Visit (HOSPITAL_COMMUNITY): Payer: Self-pay

## 2022-02-04 ENCOUNTER — Other Ambulatory Visit: Payer: Self-pay | Admitting: Urology

## 2022-02-04 ENCOUNTER — Other Ambulatory Visit (HOSPITAL_COMMUNITY): Payer: Self-pay

## 2022-02-05 ENCOUNTER — Other Ambulatory Visit (HOSPITAL_COMMUNITY): Payer: Self-pay

## 2022-02-05 MED ORDER — "BD LUER-LOK SYRINGE 25G X 1"" 3 ML MISC"
3 refills | Status: AC
  Filled 2022-02-05: qty 20, 20d supply, fill #0
  Filled 2022-04-15: qty 20, 20d supply, fill #1
  Filled 2022-04-17: qty 5, 5d supply, fill #1
  Filled 2022-04-17: qty 15, 5d supply, fill #1
  Filled 2022-08-25: qty 20, 10d supply, fill #2
  Filled 2022-10-20: qty 20, 10d supply, fill #3

## 2022-02-06 ENCOUNTER — Other Ambulatory Visit (HOSPITAL_COMMUNITY): Payer: Self-pay

## 2022-02-08 ENCOUNTER — Other Ambulatory Visit: Payer: Self-pay | Admitting: Urology

## 2022-02-08 ENCOUNTER — Other Ambulatory Visit (HOSPITAL_COMMUNITY): Payer: Self-pay

## 2022-02-08 MED ORDER — TESTOSTERONE CYPIONATE 200 MG/ML IM SOLN
200.0000 mg | INTRAMUSCULAR | 1 refills | Status: DC
  Filled 2022-02-08: qty 6, 84d supply, fill #0
  Filled 2022-04-15: qty 10, 140d supply, fill #1
  Filled 2022-04-17: qty 6, 84d supply, fill #1
  Filled 2022-07-01: qty 6, 84d supply, fill #2

## 2022-02-12 ENCOUNTER — Other Ambulatory Visit (HOSPITAL_COMMUNITY): Payer: Self-pay

## 2022-02-18 ENCOUNTER — Other Ambulatory Visit: Payer: Self-pay | Admitting: Internal Medicine

## 2022-02-18 ENCOUNTER — Encounter: Payer: Self-pay | Admitting: Internal Medicine

## 2022-02-18 ENCOUNTER — Other Ambulatory Visit (HOSPITAL_COMMUNITY): Payer: Self-pay

## 2022-02-18 MED ORDER — GABAPENTIN 300 MG PO CAPS
300.0000 mg | ORAL_CAPSULE | Freq: Three times a day (TID) | ORAL | 0 refills | Status: DC
  Filled 2022-02-20 (×2): qty 90, 30d supply, fill #0

## 2022-02-20 ENCOUNTER — Other Ambulatory Visit (HOSPITAL_COMMUNITY): Payer: Self-pay

## 2022-02-23 ENCOUNTER — Other Ambulatory Visit (HOSPITAL_COMMUNITY): Payer: Self-pay

## 2022-02-26 ENCOUNTER — Other Ambulatory Visit (HOSPITAL_COMMUNITY): Payer: Self-pay

## 2022-02-26 DIAGNOSIS — H10503 Unspecified blepharoconjunctivitis, bilateral: Secondary | ICD-10-CM | POA: Diagnosis not present

## 2022-02-26 MED ORDER — NEOMYCIN-POLYMYXIN-DEXAMETH 3.5-10000-0.1 OP SUSP
OPHTHALMIC | 0 refills | Status: AC
  Filled 2022-02-26: qty 5, 14d supply, fill #0

## 2022-03-08 ENCOUNTER — Other Ambulatory Visit: Payer: Self-pay

## 2022-03-08 MED ORDER — COVID-19 MRNA VAC-TRIS(PFIZER) 30 MCG/0.3ML IM SUSY
0.3000 mL | PREFILLED_SYRINGE | Freq: Once | INTRAMUSCULAR | 0 refills | Status: AC
  Filled 2022-03-08: qty 0.3, 1d supply, fill #0

## 2022-03-15 ENCOUNTER — Ambulatory Visit (INDEPENDENT_AMBULATORY_CARE_PROVIDER_SITE_OTHER): Payer: Commercial Managed Care - PPO | Admitting: Internal Medicine

## 2022-03-15 ENCOUNTER — Other Ambulatory Visit (HOSPITAL_COMMUNITY): Payer: Self-pay

## 2022-03-15 ENCOUNTER — Encounter: Payer: Self-pay | Admitting: Internal Medicine

## 2022-03-15 VITALS — BP 128/71 | HR 66 | Temp 98.6°F | Ht 77.0 in | Wt 218.4 lb

## 2022-03-15 DIAGNOSIS — F4321 Adjustment disorder with depressed mood: Secondary | ICD-10-CM | POA: Diagnosis not present

## 2022-03-15 DIAGNOSIS — Z0000 Encounter for general adult medical examination without abnormal findings: Secondary | ICD-10-CM

## 2022-03-15 DIAGNOSIS — M5410 Radiculopathy, site unspecified: Secondary | ICD-10-CM | POA: Diagnosis not present

## 2022-03-15 DIAGNOSIS — F5101 Primary insomnia: Secondary | ICD-10-CM

## 2022-03-15 DIAGNOSIS — E291 Testicular hypofunction: Secondary | ICD-10-CM

## 2022-03-15 MED ORDER — FAMOTIDINE 40 MG PO TABS
40.0000 mg | ORAL_TABLET | Freq: Every day | ORAL | 3 refills | Status: DC
  Filled 2022-03-15: qty 90, 90d supply, fill #0
  Filled 2022-06-12: qty 90, 90d supply, fill #1
  Filled 2022-09-03: qty 90, 90d supply, fill #2
  Filled 2022-12-09: qty 90, 90d supply, fill #3

## 2022-03-15 MED ORDER — GABAPENTIN 300 MG PO CAPS
300.0000 mg | ORAL_CAPSULE | Freq: Three times a day (TID) | ORAL | 3 refills | Status: DC
  Filled 2022-03-15: qty 90, 30d supply, fill #0
  Filled 2022-06-12: qty 90, 30d supply, fill #1
  Filled 2022-07-22: qty 90, 30d supply, fill #2
  Filled 2022-08-25: qty 90, 30d supply, fill #3

## 2022-03-15 MED ORDER — VORTIOXETINE HBR 5 MG PO TABS
5.0000 mg | ORAL_TABLET | Freq: Every day | ORAL | 3 refills | Status: DC
  Filled 2022-03-15: qty 30, 30d supply, fill #0

## 2022-03-15 MED ORDER — CYCLOBENZAPRINE HCL 10 MG PO TABS
10.0000 mg | ORAL_TABLET | Freq: Three times a day (TID) | ORAL | 3 refills | Status: DC
  Filled 2022-03-15: qty 30, 10d supply, fill #0
  Filled 2022-08-25: qty 30, 10d supply, fill #1
  Filled 2022-10-20: qty 30, 10d supply, fill #2
  Filled 2022-11-14 – 2023-01-03 (×2): qty 30, 10d supply, fill #3

## 2022-03-15 MED ORDER — TRAMADOL HCL 50 MG PO TABS
50.0000 mg | ORAL_TABLET | Freq: Four times a day (QID) | ORAL | 1 refills | Status: DC | PRN
  Filled 2022-03-15: qty 20, 5d supply, fill #0

## 2022-03-15 NOTE — Progress Notes (Signed)
Subjective:  Patient ID: Harry Levy, male    DOB: 17-Sep-1955  Age: 67 y.o. MRN: [id number]  CC: Annual Exam and Medication Refill (Need refill on Cyclobenzaprine & Robinol & Neurontin)   HPI Harry Levy presents for a well exam  Outpatient Medications Prior to Visit  Medication Sig Dispense Refill  . anastrozole (ARIMIDEX) 1 MG tablet Take 1 tablet by mouth daily 90 tablet 3  . Cholecalciferol (VITAMIN D3) 2000 units capsule Take 1 capsule (2,000 Units total) by mouth daily. 100 capsule 3  . Coenzyme Q10 (COQ10) 100 MG CAPS Take 1 capsule by mouth daily.    . Glycopyrrolate (ROBINUL PO) Take 2 mg by mouth 2 (two) times daily.    . hyoscyamine (LEVSIN SL) 0.125 MG SL tablet Take 1-2 by mouth 4 times a day prn 30 tablet 6  . Multiple Vitamin (MULTIVITAMIN) tablet Take 1 tablet by mouth daily.    . Omega-3 Fatty Acids (FISH OIL) 1000 MG CAPS Take 1 capsule by mouth daily.    . pantoprazole (PROTONIX) 40 MG tablet Take 1 tablet (40 mg total) by mouth daily. 90 tablet 3  . SYRINGE-NEEDLE, DISP, 3 ML (B-D 3CC LUER-LOK SYR 25GX1") 25G X 1" 3 ML MISC Use as directed 20 each 3  . testosterone cypionate (DEPOTESTOSTERONE CYPIONATE) 200 MG/ML injection Inject 1 mL (200 mg total) into the muscle every 14 (fourteen) days. 10 mL 1  . tizanidine (ZANAFLEX) 2 MG capsule Take 1-2 capsules (2-4 mg total) by mouth 3 (three) times daily as needed for muscle spasms. 180 capsule 1  . vitamin C (ASCORBIC ACID) 500 MG tablet Take 500 mg by mouth daily.    . cyclobenzaprine (FLEXERIL) 10 MG tablet Take 10 mg by mouth 3 times/day as needed-between meals & bedtime for muscle spasms.    . famotidine (PEPCID) 40 MG tablet Take 1 tablet (40 mg total) by mouth daily. 90 tablet 3  . gabapentin (NEURONTIN) 300 MG capsule Take 1 capsule (300 mg total) by mouth 3 (three) times daily. Must keep appt for future refills 90 capsule 0  . traMADol (ULTRAM) 50 MG tablet TAKE 1 TABLET (50 MG TOTAL) BY MOUTH EVERY 6 (SIX)  HOURS AS NEEDED. 120 tablet 1  . tadalafil (CIALIS) 20 MG tablet Take 1 tablet (20 mg total) by mouth daily as needed for erectile dysfunction. 30 tablet 11  . Acetaminophen-Codeine 300-30 MG tablet Take 1 tablet by mouth every 6 hours as needed for discomfort 16 tablet 0  . amoxicillin (AMOXIL) 875 MG tablet Take 1 tablet (875 mg total) by mouth 2 (two) times daily until gone 20 tablet 0  . anastrozole (ARIMIDEX) 1 MG tablet Take 1 tablet (1 mg total) by mouth daily. 90 tablet 3  . anastrozole (ARIMIDEX) 1 MG tablet Take 1 tablet by mouth daily 90 tablet 3  . COVID-19 mRNA bivalent vaccine, Pfizer, (PFIZER COVID-19 VAC BIVALENT) injection Inject into the muscle. (Patient not taking: Reported on 03/15/2022) 0.3 mL 0  . ibuprofen (ADVIL) 800 MG tablet Take 1 tablet (800 mg total) by mouth every 6 (six) hours as needed for discomfort 24 tablet 0  . ibuprofen (ADVIL,MOTRIN) 600 MG tablet Take 600 mg by mouth 2 (two) times daily.    . Na Sulfate-K Sulfate-Mg Sulf 17.5-3.13-1.6 GM/177ML SOLN Take as directed per procedure instructions 354 mL 0  . SYRINGE-NEEDLE, DISP, 3 ML 25G X 1-1/2" 3 ML MISC Use as directed 20 each 3  . Syringe/Needle, Disp, (SYRINGE 3CC/25GX1")  25G X 1" 3 ML MISC Use as directed 20 each 3  . testosterone cypionate (DEPOTESTOSTERONE CYPIONATE) 200 MG/ML injection every 14 (fourteen) days.   0  . testosterone cypionate (DEPOTESTOSTERONE CYPIONATE) 200 MG/ML injection INJECT 1 ML INTO THE MUSCLE EVERY 2 WEEKS 10 mL 1  . testosterone cypionate (DEPOTESTOSTERONE CYPIONATE) 200 MG/ML injection INJECT 1 ML INTO THE MUSCLE EVERY 2 WEEKS 10 mL 3  . testosterone cypionate (DEPOTESTOSTERONE CYPIONATE) 200 MG/ML injection INJECT 1ML INTRAMUSCULAR EVERY 2 WEEKS 10 mL 1  . testosterone cypionate (DEPOTESTOSTERONE CYPIONATE) 200 MG/ML injection Inject 1 ML into the muscle every 2 weeks 10 mL 1   No facility-administered medications prior to visit.    ROS: Review of Systems  Objective:  BP  128/71 (BP Location: Left Arm)   Pulse 66   Temp 98.6 F (37 C) (Oral)   Ht '6\' 5"'$  (1.956 m)   Wt 218 lb 6.4 oz (99.1 kg)   SpO2 98%   BMI 25.90 kg/m   BP Readings from Last 3 Encounters:  03/15/22 128/71  01/27/20 132/84  11/17/18 106/68    Wt Readings from Last 3 Encounters:  03/15/22 218 lb 6.4 oz (99.1 kg)  01/27/20 222 lb (100.7 kg)  11/17/18 211 lb (95.7 kg)    Physical Exam  Lab Results  Component Value Date   WBC 7.4 05/11/2020   HGB 16.6 05/11/2020   HCT 48.5 05/11/2020   PLT 204.0 05/11/2020   GLUCOSE 108 (H) 05/11/2020   CHOL 180 05/11/2020   TRIG 42.0 05/11/2020   HDL 54.10 05/11/2020   LDLCALC 118 (H) 05/11/2020   ALT 24 05/11/2020   AST 19 05/11/2020   NA 139 05/11/2020   K 4.1 05/11/2020   CL 105 05/11/2020   CREATININE 1.12 05/11/2020   BUN 17 05/11/2020   CO2 28 05/11/2020   TSH 1.27 05/11/2020   PSA 1.58 05/11/2020   HGBA1C 5.6 05/12/2020    CT CARDIAC SCORING  Addendum Date: 02/21/2020   ADDENDUM REPORT: 02/21/2020 15:23 CLINICAL DATA:  Risk stratification EXAM: Coronary Calcium Score TECHNIQUE: The patient was scanned on a [employer] scanner. Axial non-contrast 3 mm slices were carried out through the heart. The data set was analyzed on a dedicated work station and scored using the [place]. FINDINGS: Non-cardiac: See separate report from [facility]. Ascending Aorta: Normal caliber. Pericardium: Normal. Coronary arteries: Normal origins. IMPRESSION: Coronary calcium score of 0. Eleonore Chiquito, MD Electronically Signed   By: Eleonore Chiquito   On: 02/21/2020 15:23   Result Date: 02/21/2020 EXAM: OVER-READ INTERPRETATION  CT CHEST The following report is an over-read performed by radiologist Dr. Vinnie Langton of [facility], [place] on 02/21/2020. This over-read does not include interpretation of cardiac or coronary anatomy or pathology. The coronary calcium score interpretation by the cardiologist is attached. COMPARISON:   None. FINDINGS: Within the visualized portions of the thorax there are no suspicious appearing pulmonary nodules or masses, there is no acute consolidative airspace disease, no pleural effusions, no pneumothorax and no lymphadenopathy. Visualized portions of the upper abdomen are unremarkable. There are no aggressive appearing lytic or blastic lesions noted in the visualized portions of the skeleton. IMPRESSION: No significant incidental noncardiac findings are noted. Electronically Signed: By: Vinnie Langton M.D. On: 02/21/2020 14:57    Assessment & Plan:   Problem List Items Addressed This Visit       Endocrine   Hypogonadism in male   Relevant Orders   Vitamin B12  VITAMIN D 25 Hydroxy (Vit-D Deficiency, Fractures)     Other   Well adult exam - Primary   Relevant Orders   TSH   Urinalysis   CBC with Differential/Platelet   Lipid panel   Comprehensive metabolic panel   Vitamin E17   VITAMIN D 25 Hydroxy (Vit-D Deficiency, Fractures)      Meds ordered this encounter  Medications  . cyclobenzaprine (FLEXERIL) 10 MG tablet    Sig: Take 1 tablet (10 mg total) by mouth 3 times/day as needed-between meals & bedtime for muscle spasms.    Dispense:  30 tablet    Refill:  3  . famotidine (PEPCID) 40 MG tablet    Sig: Take 1 tablet (40 mg total) by mouth daily.    Dispense:  90 tablet    Refill:  3  . gabapentin (NEURONTIN) 300 MG capsule    Sig: Take 1 capsule (300 mg total) by mouth 3 (three) times daily. Must keep appt for future refills    Dispense:  90 capsule    Refill:  3  . traMADol (ULTRAM) 50 MG tablet    Sig: Take 1 tablet (50 mg total) by mouth every 6 (six) hours as needed.    Dispense:  20 tablet    Refill:  1      Follow-up: No follow-ups on file.  Walker Kehr, MD

## 2022-03-16 ENCOUNTER — Other Ambulatory Visit (HOSPITAL_COMMUNITY): Payer: Self-pay

## 2022-03-18 DIAGNOSIS — F4321 Adjustment disorder with depressed mood: Secondary | ICD-10-CM | POA: Insufficient documentation

## 2022-03-18 NOTE — Assessment & Plan Note (Signed)
Tramadol  - rare use  Potential benefits of a long term opioids use as well as potential risks (i.e. addiction risk, apnea etc) and complications (i.e. Somnolence, constipation and others) were explained to the patient and were aknowledged. Continue gabapentin

## 2022-03-18 NOTE — Assessment & Plan Note (Signed)
We discussed age appropriate health related issues, including available/recomended screening tests and vaccinations. We discussed a need for adhering to healthy diet and exercise. Labs were ordered to be later reviewed . All questions were answered. Colon 2009 and 2020 Dr [name], due 2030  Coronary calcium score of 0.

## 2022-03-18 NOTE — Assessment & Plan Note (Signed)
Discussed.  Rest more.  Start low-dose Trintellix 5 mg daily.  Will increase dose if tolerated

## 2022-03-19 ENCOUNTER — Other Ambulatory Visit (HOSPITAL_COMMUNITY): Payer: Self-pay

## 2022-03-20 ENCOUNTER — Encounter: Payer: Self-pay | Admitting: Internal Medicine

## 2022-03-20 ENCOUNTER — Other Ambulatory Visit (HOSPITAL_COMMUNITY): Payer: Self-pay

## 2022-03-20 NOTE — Telephone Encounter (Signed)
Med never refill by MD. Lurline Del for approval../lmb

## 2022-03-25 ENCOUNTER — Other Ambulatory Visit (HOSPITAL_COMMUNITY): Payer: Self-pay

## 2022-03-25 ENCOUNTER — Other Ambulatory Visit: Payer: Self-pay | Admitting: Internal Medicine

## 2022-03-25 MED ORDER — GLYCOPYRROLATE 2 MG PO TABS
2.0000 mg | ORAL_TABLET | Freq: Three times a day (TID) | ORAL | 5 refills | Status: DC | PRN
  Filled 2022-03-25: qty 90, 30d supply, fill #0
  Filled 2022-06-12: qty 90, 30d supply, fill #1
  Filled 2023-03-03: qty 90, 30d supply, fill #2

## 2022-04-17 ENCOUNTER — Other Ambulatory Visit (HOSPITAL_COMMUNITY): Payer: Self-pay

## 2022-04-17 MED ORDER — "BD DISP NEEDLES 18G X 1-1/2"" MISC"
99 refills | Status: AC
  Filled 2022-04-17: qty 12, 84d supply, fill #0
  Filled 2022-08-25: qty 12, 84d supply, fill #1
  Filled 2022-10-20 – 2023-01-03 (×3): qty 12, 84d supply, fill #2

## 2022-04-18 ENCOUNTER — Other Ambulatory Visit: Payer: Self-pay

## 2022-04-22 ENCOUNTER — Other Ambulatory Visit (HOSPITAL_COMMUNITY): Payer: Self-pay

## 2022-05-09 ENCOUNTER — Other Ambulatory Visit (HOSPITAL_COMMUNITY): Payer: Self-pay

## 2022-06-12 ENCOUNTER — Other Ambulatory Visit: Payer: Self-pay

## 2022-06-13 ENCOUNTER — Ambulatory Visit: Payer: Commercial Managed Care - PPO | Admitting: Internal Medicine

## 2022-06-17 DIAGNOSIS — Z85828 Personal history of other malignant neoplasm of skin: Secondary | ICD-10-CM | POA: Diagnosis not present

## 2022-06-17 DIAGNOSIS — L570 Actinic keratosis: Secondary | ICD-10-CM | POA: Diagnosis not present

## 2022-06-17 DIAGNOSIS — D2262 Melanocytic nevi of left upper limb, including shoulder: Secondary | ICD-10-CM | POA: Diagnosis not present

## 2022-06-17 DIAGNOSIS — L820 Inflamed seborrheic keratosis: Secondary | ICD-10-CM | POA: Diagnosis not present

## 2022-06-17 DIAGNOSIS — D1801 Hemangioma of skin and subcutaneous tissue: Secondary | ICD-10-CM | POA: Diagnosis not present

## 2022-06-17 DIAGNOSIS — L905 Scar conditions and fibrosis of skin: Secondary | ICD-10-CM | POA: Diagnosis not present

## 2022-06-17 DIAGNOSIS — D225 Melanocytic nevi of trunk: Secondary | ICD-10-CM | POA: Diagnosis not present

## 2022-06-17 DIAGNOSIS — D2261 Melanocytic nevi of right upper limb, including shoulder: Secondary | ICD-10-CM | POA: Diagnosis not present

## 2022-06-17 DIAGNOSIS — L821 Other seborrheic keratosis: Secondary | ICD-10-CM | POA: Diagnosis not present

## 2022-07-06 IMAGING — CT CT CARDIAC CORONARY ARTERY CALCIUM SCORE
3 series · 14 of 20 positions shown, 15 images · non-contrast
Comparison: None.
COMPARISON: None.

Addendum:
EXAM:
OVER-READ INTERPRETATION  CT CHEST

The following report is an over-read performed by radiologist Dr.
Prince Perz [REDACTED] on 02/21/2020. This
over-read does not include interpretation of cardiac or coronary
anatomy or pathology. The coronary calcium score interpretation by
the cardiologist is attached.
CLINICAL DATA: Risk stratification
Coronary Calcium Score
TECHNIQUE: The patient was scanned on a Siem[employer] Force scanner. Axial
non-contrast 3 mm slices were carried out through the heart. The
data set was analyzed on a dedicated work station and scored using
the [place] method.

[Series 2: casc 3.0 bv41 2 bestsyst 298 ms · axial · 0.41mm/px · z∈[-256,-166]mm · 4 of 52 slices shown, 5 images]
[im 11/52  vessel]
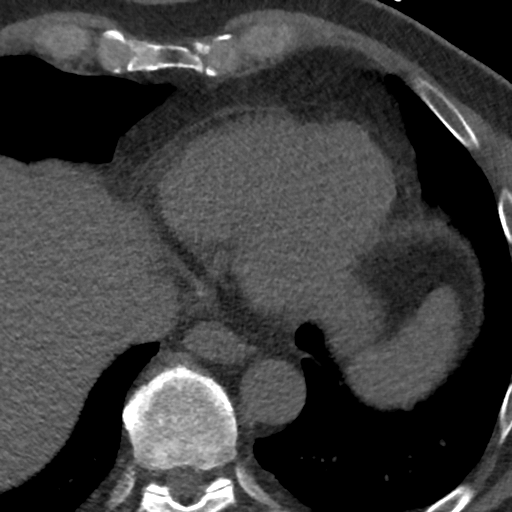
[im 11/52  lung]
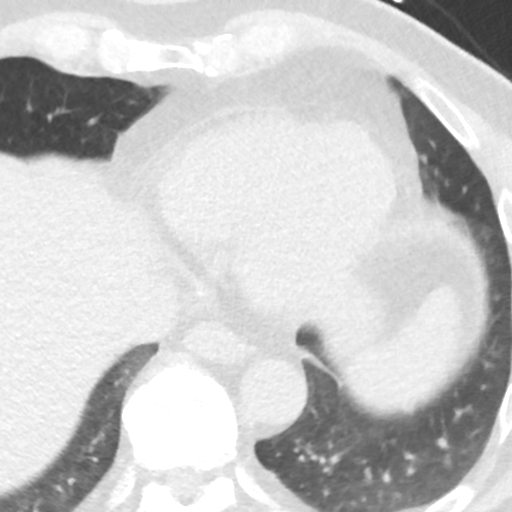
[im 21/52  vessel]
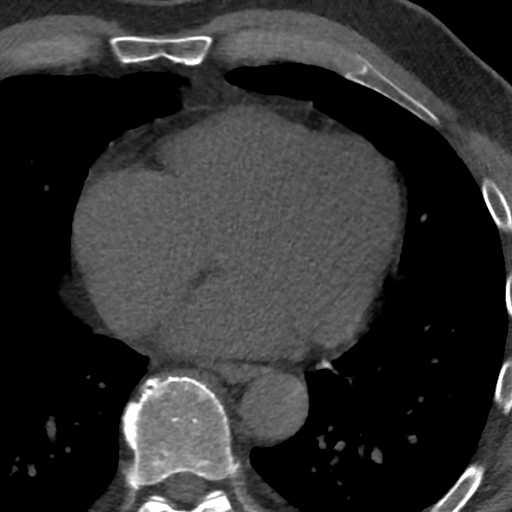
[im 31/52  vessel]
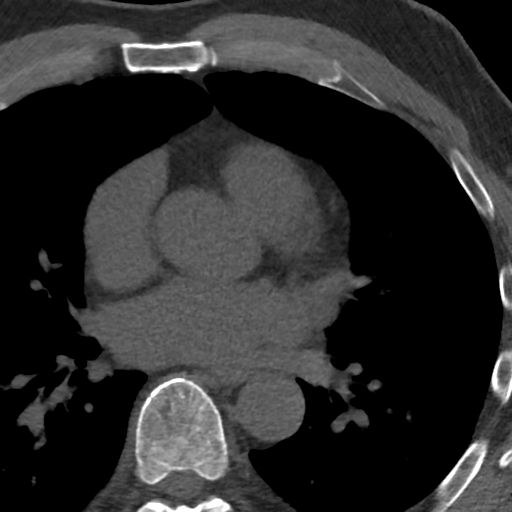
[im 41/52  vessel]
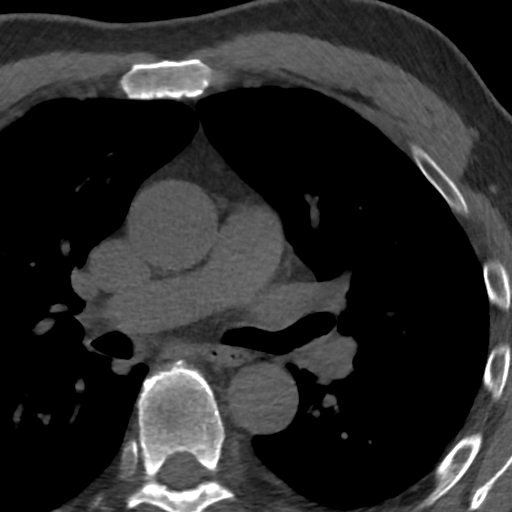

[Series 3: lung 310 ms · axial · 0.70mm/px · z∈[-262,-160]mm · 5 of 52 slices shown]
[im 9/52  lung]
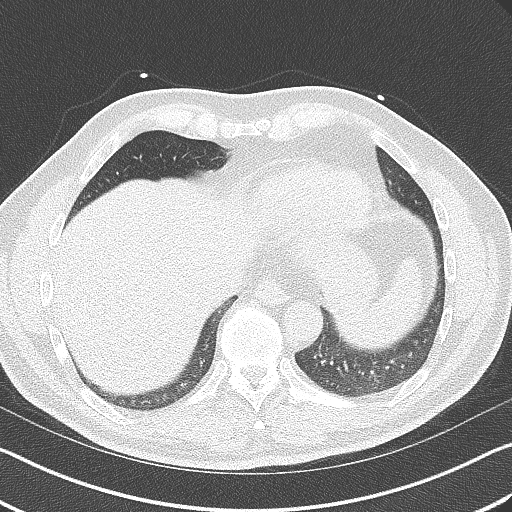
[im 18/52  lung]
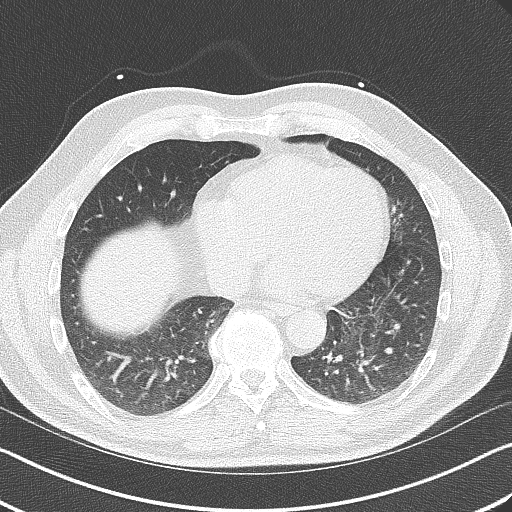
[im 26/52  lung]
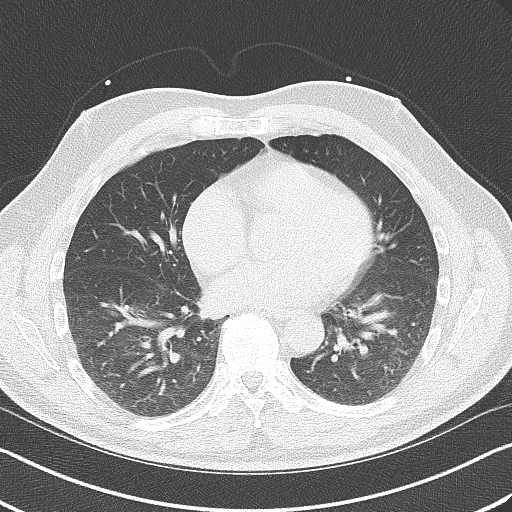
[im 35/52  lung]
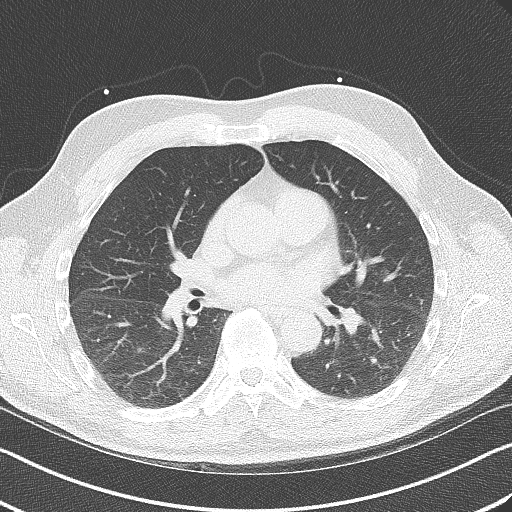
[im 43/52  lung]
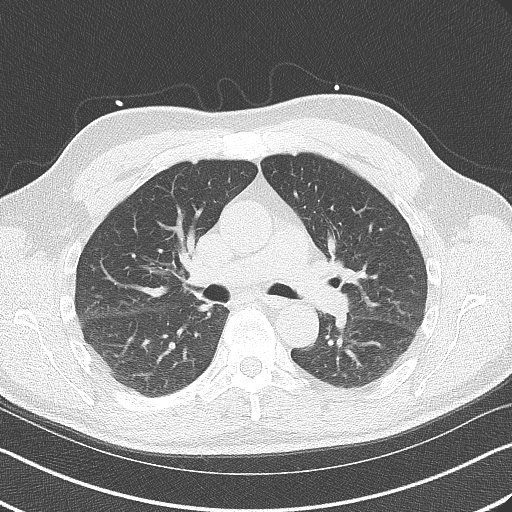

[Series 4: lung st 310 ms · axial · 0.70mm/px · z∈[-262,-160]mm · 5 of 52 slices shown]
[im 9/52  lung]
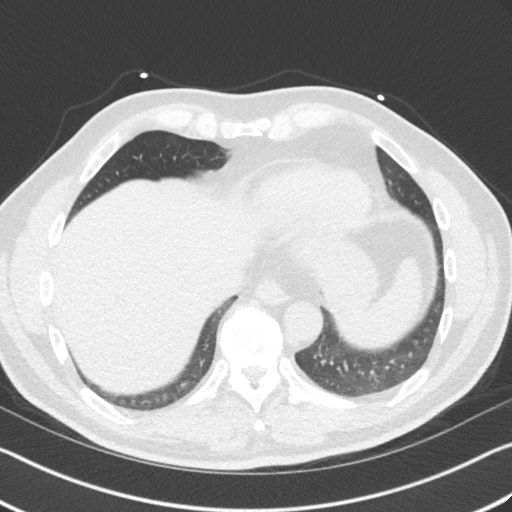
[im 18/52  lung]
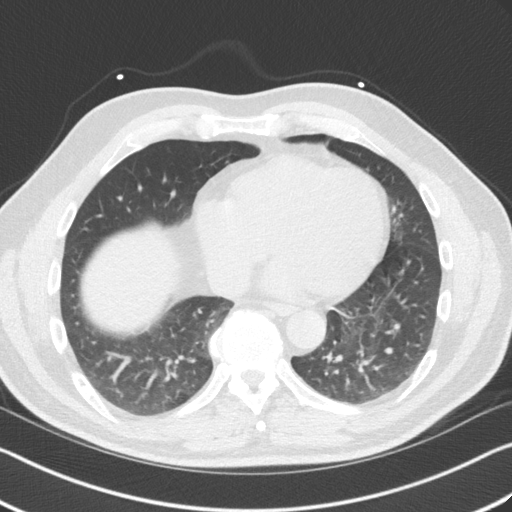
[im 26/52  lung]
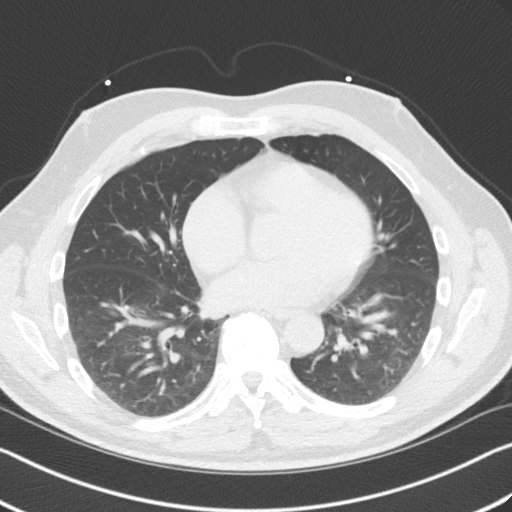
[im 35/52  lung]
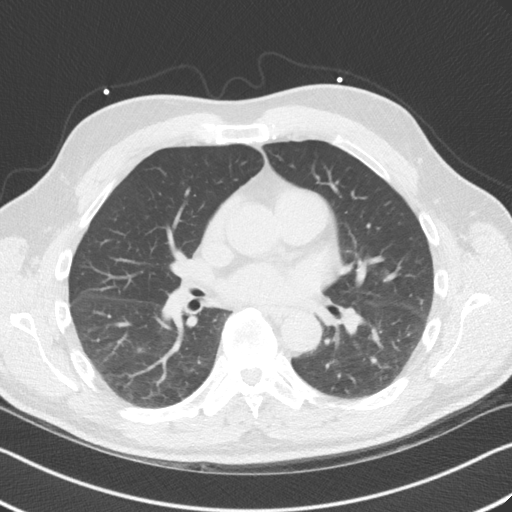
[im 43/52  lung]
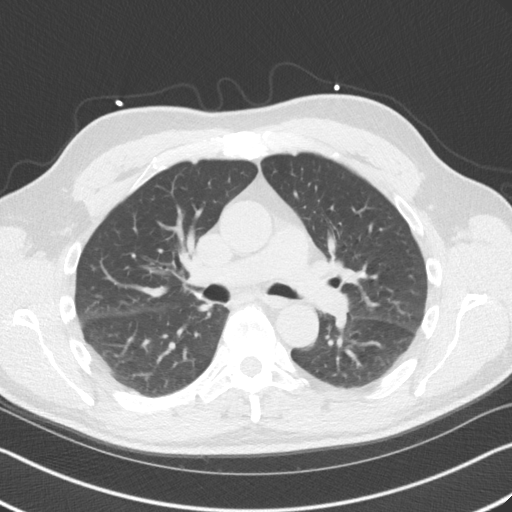

[14 of 20 positions shown; findings below may reference images not displayed]

FINDINGS: Within the visualized portions of the thorax there are no suspicious
appearing pulmonary nodules or masses, there is no acute
consolidative airspace disease, no pleural effusions, no
pneumothorax and no lymphadenopathy. Visualized portions of the
upper abdomen are unremarkable. There are no aggressive appearing
lytic or blastic lesions noted in the visualized portions of the
skeleton.
IMPRESSION: No significant incidental noncardiac findings are noted.
FINDINGS: Non-cardiac: See separate report from [REDACTED].

Ascending Aorta: Normal caliber.

Pericardium: Normal.

Coronary arteries: Normal origins.
IMPRESSION: Coronary calcium score of 0.

*** End of Addendum ***
EXAM:
OVER-READ INTERPRETATION  CT CHEST

The following report is an over-read performed by radiologist Dr.
Prince Perz [REDACTED] on 02/21/2020. This
over-read does not include interpretation of cardiac or coronary
anatomy or pathology. The coronary calcium score interpretation by
the cardiologist is attached.
FINDINGS: Within the visualized portions of the thorax there are no suspicious
appearing pulmonary nodules or masses, there is no acute
consolidative airspace disease, no pleural effusions, no
pneumothorax and no lymphadenopathy. Visualized portions of the
upper abdomen are unremarkable. There are no aggressive appearing
lytic or blastic lesions noted in the visualized portions of the
skeleton.
IMPRESSION: No significant incidental noncardiac findings are noted.

## 2022-07-15 DIAGNOSIS — H5213 Myopia, bilateral: Secondary | ICD-10-CM | POA: Diagnosis not present

## 2022-07-15 DIAGNOSIS — H2513 Age-related nuclear cataract, bilateral: Secondary | ICD-10-CM | POA: Diagnosis not present

## 2022-07-15 DIAGNOSIS — H01002 Unspecified blepharitis right lower eyelid: Secondary | ICD-10-CM | POA: Diagnosis not present

## 2022-07-15 DIAGNOSIS — H01005 Unspecified blepharitis left lower eyelid: Secondary | ICD-10-CM | POA: Diagnosis not present

## 2022-07-22 ENCOUNTER — Other Ambulatory Visit (HOSPITAL_COMMUNITY): Payer: Self-pay

## 2022-07-22 ENCOUNTER — Other Ambulatory Visit: Payer: Self-pay

## 2022-07-30 ENCOUNTER — Other Ambulatory Visit (HOSPITAL_COMMUNITY): Payer: Self-pay

## 2022-07-30 MED ORDER — ANASTROZOLE 1 MG PO TABS
1.0000 mg | ORAL_TABLET | Freq: Every day | ORAL | 3 refills | Status: AC
  Filled 2022-07-30: qty 90, 90d supply, fill #0
  Filled 2022-10-23: qty 90, 90d supply, fill #1
  Filled 2023-01-21: qty 90, 90d supply, fill #2

## 2022-08-09 ENCOUNTER — Other Ambulatory Visit (HOSPITAL_COMMUNITY): Payer: Self-pay

## 2022-08-19 DIAGNOSIS — Z85828 Personal history of other malignant neoplasm of skin: Secondary | ICD-10-CM | POA: Diagnosis not present

## 2022-08-19 DIAGNOSIS — L820 Inflamed seborrheic keratosis: Secondary | ICD-10-CM | POA: Diagnosis not present

## 2022-08-19 DIAGNOSIS — L821 Other seborrheic keratosis: Secondary | ICD-10-CM | POA: Diagnosis not present

## 2022-08-19 DIAGNOSIS — L570 Actinic keratosis: Secondary | ICD-10-CM | POA: Diagnosis not present

## 2022-08-21 DIAGNOSIS — E291 Testicular hypofunction: Secondary | ICD-10-CM | POA: Diagnosis not present

## 2022-08-21 DIAGNOSIS — N400 Enlarged prostate without lower urinary tract symptoms: Secondary | ICD-10-CM | POA: Diagnosis not present

## 2022-08-21 DIAGNOSIS — E349 Endocrine disorder, unspecified: Secondary | ICD-10-CM | POA: Diagnosis not present

## 2022-08-27 ENCOUNTER — Other Ambulatory Visit (HOSPITAL_COMMUNITY): Payer: Self-pay

## 2022-09-03 ENCOUNTER — Other Ambulatory Visit: Payer: Self-pay

## 2022-10-08 ENCOUNTER — Other Ambulatory Visit (HOSPITAL_COMMUNITY): Payer: Self-pay

## 2022-10-10 ENCOUNTER — Other Ambulatory Visit (HOSPITAL_BASED_OUTPATIENT_CLINIC_OR_DEPARTMENT_OTHER): Payer: Self-pay

## 2022-10-10 MED ORDER — TETANUS-DIPHTH-ACELL PERTUSSIS 5-2.5-18.5 LF-MCG/0.5 IM SUSY
0.5000 mL | PREFILLED_SYRINGE | Freq: Once | INTRAMUSCULAR | 0 refills | Status: AC
  Filled 2022-10-10: qty 0.5, 1d supply, fill #0

## 2022-10-15 ENCOUNTER — Other Ambulatory Visit (HOSPITAL_COMMUNITY): Payer: Self-pay

## 2022-10-15 MED ORDER — TESTOSTERONE CYPIONATE 200 MG/ML IM SOLN
200.0000 mg | INTRAMUSCULAR | 1 refills | Status: AC
  Filled 2022-10-15: qty 6, 84d supply, fill #0
  Filled 2022-12-31: qty 6, 84d supply, fill #1

## 2022-10-16 ENCOUNTER — Other Ambulatory Visit (HOSPITAL_COMMUNITY): Payer: Self-pay

## 2022-10-21 ENCOUNTER — Other Ambulatory Visit (HOSPITAL_COMMUNITY): Payer: Self-pay

## 2022-10-22 ENCOUNTER — Encounter: Payer: Self-pay | Admitting: Internal Medicine

## 2022-10-22 ENCOUNTER — Other Ambulatory Visit (HOSPITAL_BASED_OUTPATIENT_CLINIC_OR_DEPARTMENT_OTHER): Payer: Self-pay

## 2022-10-23 ENCOUNTER — Other Ambulatory Visit: Payer: Self-pay | Admitting: Internal Medicine

## 2022-10-23 ENCOUNTER — Other Ambulatory Visit: Payer: Self-pay

## 2022-10-23 ENCOUNTER — Other Ambulatory Visit (HOSPITAL_COMMUNITY): Payer: Self-pay

## 2022-10-23 MED ORDER — GABAPENTIN 300 MG PO CAPS
300.0000 mg | ORAL_CAPSULE | Freq: Three times a day (TID) | ORAL | 3 refills | Status: DC
  Filled 2022-10-23: qty 90, 30d supply, fill #0
  Filled 2022-11-18: qty 90, 30d supply, fill #1
  Filled 2022-12-18: qty 90, 30d supply, fill #2
  Filled 2023-01-17: qty 90, 30d supply, fill #3

## 2022-10-24 ENCOUNTER — Other Ambulatory Visit (HOSPITAL_COMMUNITY): Payer: Self-pay

## 2022-10-25 ENCOUNTER — Other Ambulatory Visit: Payer: Self-pay | Admitting: Internal Medicine

## 2022-10-25 ENCOUNTER — Other Ambulatory Visit (HOSPITAL_COMMUNITY): Payer: Self-pay

## 2022-10-25 MED ORDER — ESCITALOPRAM OXALATE 10 MG PO TABS
10.0000 mg | ORAL_TABLET | Freq: Every day | ORAL | 3 refills | Status: DC
  Filled 2022-10-25: qty 90, 90d supply, fill #0
  Filled 2023-01-18: qty 90, 90d supply, fill #1

## 2022-10-29 ENCOUNTER — Telehealth: Payer: Self-pay | Admitting: *Deleted

## 2022-10-29 DIAGNOSIS — K449 Diaphragmatic hernia without obstruction or gangrene: Secondary | ICD-10-CM

## 2022-10-29 DIAGNOSIS — Z01818 Encounter for other preprocedural examination: Secondary | ICD-10-CM

## 2022-10-29 DIAGNOSIS — K219 Gastro-esophageal reflux disease without esophagitis: Secondary | ICD-10-CM

## 2022-10-29 NOTE — Telephone Encounter (Signed)
-----   Message from Nurse [place] P sent at 10/29/2022  1:25 PM EDT -----  ----- Message ----- From: Shellia Cleverly, DO Sent: 10/29/2022  12:11 PM EDT To: Missy Sabins, RN  Can you please order an esophagram for Dr. Russella Dar under my name? Indication is GERD, hiatal hernia, and pre-operative assessment.   Thanks!

## 2022-10-29 NOTE — Telephone Encounter (Signed)
Patient has been scheduled for barium swallow at [facility] on 11/15/22 at 9:00 am, 8:45 am arrival. NPO 3 hours prior. Patient advised.

## 2022-11-15 ENCOUNTER — Encounter: Payer: Self-pay | Admitting: Gastroenterology

## 2022-11-15 ENCOUNTER — Ambulatory Visit (HOSPITAL_COMMUNITY)
Admission: RE | Admit: 2022-11-15 | Discharge: 2022-11-15 | Disposition: A | Discharge status: Home / Self Care | Payer: Commercial Managed Care - PPO | Source: Ambulatory Visit | Attending: Gastroenterology | Admitting: Gastroenterology

## 2022-11-15 DIAGNOSIS — K219 Gastro-esophageal reflux disease without esophagitis: Secondary | ICD-10-CM | POA: Diagnosis not present

## 2022-11-15 DIAGNOSIS — Z4682 Encounter for fitting and adjustment of non-vascular catheter: Secondary | ICD-10-CM | POA: Diagnosis not present

## 2022-11-15 DIAGNOSIS — Z01818 Encounter for other preprocedural examination: Secondary | ICD-10-CM | POA: Diagnosis not present

## 2022-11-15 DIAGNOSIS — K449 Diaphragmatic hernia without obstruction or gangrene: Secondary | ICD-10-CM | POA: Diagnosis not present

## 2022-11-15 DIAGNOSIS — K224 Dyskinesia of esophagus: Secondary | ICD-10-CM | POA: Diagnosis not present

## 2022-11-18 ENCOUNTER — Other Ambulatory Visit: Payer: Self-pay | Admitting: *Deleted

## 2022-11-18 DIAGNOSIS — K219 Gastro-esophageal reflux disease without esophagitis: Secondary | ICD-10-CM

## 2022-11-18 DIAGNOSIS — K449 Diaphragmatic hernia without obstruction or gangrene: Secondary | ICD-10-CM

## 2022-11-18 NOTE — Telephone Encounter (Signed)
Patient is scheduled for esophageal manometry at [facility] on Wednesday, 12/04/22 at 2:00 pm (scheduled with Clydie Braun). Printed prep instructions have been created and given to patient.

## 2022-11-20 ENCOUNTER — Telehealth: Payer: Self-pay

## 2022-11-20 ENCOUNTER — Other Ambulatory Visit (HOSPITAL_COMMUNITY): Payer: Self-pay

## 2022-11-20 MED ORDER — PANTOPRAZOLE SODIUM 40 MG PO TBEC
40.0000 mg | DELAYED_RELEASE_TABLET | Freq: Two times a day (BID) | ORAL | 4 refills | Status: DC
  Filled 2022-11-20 – 2023-01-03 (×6): qty 180, 90d supply, fill #0
  Filled ????-??-??: fill #0

## 2022-11-20 NOTE — Telephone Encounter (Signed)
-----   Message from Yancey Flemings sent at 11/20/2022 12:25 PM EDT ----- Regarding: Adjustment of pantoprazole prescription Bonita Quin, Please change Dr. Ardell Isaacs pantoprazole prescription to 40 mg p.o. twice daily; #180 (3 months); 4 refills.  He uses [facility]. Thanks, JP

## 2022-11-22 ENCOUNTER — Other Ambulatory Visit (HOSPITAL_COMMUNITY): Payer: Self-pay

## 2022-11-23 ENCOUNTER — Other Ambulatory Visit (HOSPITAL_COMMUNITY): Payer: Self-pay

## 2022-12-04 ENCOUNTER — Encounter (HOSPITAL_COMMUNITY)
Admission: RE | Disposition: A | Discharge status: Home / Self Care | Payer: Self-pay | Source: Ambulatory Visit | Attending: Gastroenterology

## 2022-12-04 ENCOUNTER — Ambulatory Visit (HOSPITAL_COMMUNITY)
Admission: RE | Admit: 2022-12-04 | Discharge: 2022-12-04 | Disposition: A | Discharge status: Home / Self Care | Payer: Commercial Managed Care - PPO | Source: Ambulatory Visit | Attending: Gastroenterology | Admitting: Gastroenterology

## 2022-12-04 DIAGNOSIS — K449 Diaphragmatic hernia without obstruction or gangrene: Secondary | ICD-10-CM

## 2022-12-04 DIAGNOSIS — K219 Gastro-esophageal reflux disease without esophagitis: Secondary | ICD-10-CM | POA: Diagnosis not present

## 2022-12-04 HISTORY — PX: ESOPHAGEAL MANOMETRY: SHX5429

## 2022-12-04 SURGERY — MANOMETRY, ESOPHAGUS

## 2022-12-04 MED ORDER — LIDOCAINE VISCOUS HCL 2 % MT SOLN
OROMUCOSAL | Status: AC
  Filled 2022-12-04: qty 15

## 2022-12-04 SURGICAL SUPPLY — 2 items
FACESHIELD LNG OPTICON STERILE (SAFETY) IMPLANT
GLOVE BIO SURGEON STRL SZ8 (GLOVE) ×4 IMPLANT

## 2022-12-04 NOTE — Progress Notes (Signed)
Esophageal Manometry done per protocol. Patient tolerated well without distress or complication.

## 2022-12-06 DIAGNOSIS — K449 Diaphragmatic hernia without obstruction or gangrene: Secondary | ICD-10-CM

## 2022-12-08 ENCOUNTER — Encounter (HOSPITAL_COMMUNITY): Payer: Self-pay | Admitting: Gastroenterology

## 2022-12-09 ENCOUNTER — Other Ambulatory Visit: Payer: Self-pay

## 2022-12-09 ENCOUNTER — Other Ambulatory Visit (HOSPITAL_COMMUNITY): Payer: Self-pay

## 2022-12-11 ENCOUNTER — Telehealth: Payer: Self-pay | Admitting: Gastroenterology

## 2022-12-11 NOTE — Telephone Encounter (Signed)
Esophageal manometry results reviewed with Dr. Russella Dar and demonstrate normal motility with 100% peristaltic contractions and complete bolus clearance on 8/10 swallows.  Mildly elevated basal UES pressures at 112 (ULN 104), with normal residual UES pressures. Curious if this is related to the osteophytes noted on recent esophagram.  Otherwise normal relaxation of the EG junction.  Manometric evidence of a 2 cm hiatal hernia.  Plan to move forward with referral to Dr. Andrey Campanile at [facility] to discuss laparoscopic hiatal hernia repair and surgical fundoplication.  Not a candidate for TIF due to osteophytes noted on esophagram.

## 2022-12-12 ENCOUNTER — Other Ambulatory Visit (HOSPITAL_COMMUNITY): Payer: Self-pay

## 2022-12-13 ENCOUNTER — Ambulatory Visit: Payer: Commercial Managed Care - PPO | Admitting: Gastroenterology

## 2022-12-16 NOTE — Telephone Encounter (Signed)
Referral faxed to [facility] [phone] regardng [facility] repair sucessfully

## 2022-12-18 ENCOUNTER — Other Ambulatory Visit: Payer: Self-pay

## 2022-12-18 ENCOUNTER — Other Ambulatory Visit (HOSPITAL_COMMUNITY): Payer: Self-pay

## 2022-12-19 ENCOUNTER — Other Ambulatory Visit (HOSPITAL_COMMUNITY): Payer: Self-pay

## 2022-12-24 ENCOUNTER — Other Ambulatory Visit (HOSPITAL_COMMUNITY): Payer: Self-pay

## 2022-12-27 ENCOUNTER — Other Ambulatory Visit (HOSPITAL_COMMUNITY): Payer: Self-pay

## 2023-01-02 ENCOUNTER — Other Ambulatory Visit: Payer: Self-pay

## 2023-01-03 ENCOUNTER — Ambulatory Visit: Payer: Commercial Managed Care - PPO | Admitting: Gastroenterology

## 2023-01-03 ENCOUNTER — Other Ambulatory Visit (HOSPITAL_COMMUNITY): Payer: Self-pay

## 2023-01-20 ENCOUNTER — Other Ambulatory Visit (HOSPITAL_COMMUNITY): Payer: Self-pay

## 2023-01-24 ENCOUNTER — Other Ambulatory Visit (HOSPITAL_COMMUNITY): Payer: Self-pay

## 2023-02-14 ENCOUNTER — Other Ambulatory Visit: Payer: Self-pay | Admitting: Internal Medicine

## 2023-02-15 ENCOUNTER — Other Ambulatory Visit (HOSPITAL_COMMUNITY): Payer: Self-pay

## 2023-02-15 MED ORDER — GABAPENTIN 300 MG PO CAPS
300.0000 mg | ORAL_CAPSULE | Freq: Three times a day (TID) | ORAL | 3 refills | Status: DC
  Filled 2023-02-15 – 2023-02-17 (×2): qty 90, 30d supply, fill #0

## 2023-02-17 ENCOUNTER — Other Ambulatory Visit: Payer: Self-pay

## 2023-02-17 ENCOUNTER — Other Ambulatory Visit (HOSPITAL_COMMUNITY): Payer: Self-pay

## 2023-02-21 ENCOUNTER — Other Ambulatory Visit (HOSPITAL_COMMUNITY): Payer: Self-pay

## 2023-03-05 ENCOUNTER — Other Ambulatory Visit: Payer: Self-pay | Admitting: Internal Medicine

## 2023-03-07 ENCOUNTER — Other Ambulatory Visit (HOSPITAL_COMMUNITY): Payer: Self-pay

## 2023-03-20 ENCOUNTER — Encounter: Payer: Commercial Managed Care - PPO | Admitting: Internal Medicine

## 2023-04-01 ENCOUNTER — Other Ambulatory Visit (HOSPITAL_COMMUNITY): Payer: Self-pay

## 2023-04-03 ENCOUNTER — Encounter: Payer: Self-pay | Admitting: Internal Medicine

## 2023-04-03 ENCOUNTER — Ambulatory Visit (INDEPENDENT_AMBULATORY_CARE_PROVIDER_SITE_OTHER): Payer: Medicare Other | Admitting: Internal Medicine

## 2023-04-03 VITALS — BP 120/78 | HR 58 | Temp 98.6°F | Ht 77.0 in | Wt 218.0 lb

## 2023-04-03 DIAGNOSIS — K449 Diaphragmatic hernia without obstruction or gangrene: Secondary | ICD-10-CM | POA: Diagnosis not present

## 2023-04-03 DIAGNOSIS — M19041 Primary osteoarthritis, right hand: Secondary | ICD-10-CM | POA: Diagnosis not present

## 2023-04-03 DIAGNOSIS — Z0000 Encounter for general adult medical examination without abnormal findings: Secondary | ICD-10-CM

## 2023-04-03 DIAGNOSIS — Z23 Encounter for immunization: Secondary | ICD-10-CM | POA: Diagnosis not present

## 2023-04-03 DIAGNOSIS — Z13220 Encounter for screening for lipoid disorders: Secondary | ICD-10-CM

## 2023-04-03 DIAGNOSIS — M19042 Primary osteoarthritis, left hand: Secondary | ICD-10-CM

## 2023-04-03 LAB — COMPREHENSIVE METABOLIC PANEL
ALT: 22 U/L (ref 0–53)
AST: 21 U/L (ref 0–37)
Albumin: 4.4 g/dL (ref 3.5–5.2)
Alkaline Phosphatase: 50 U/L (ref 39–117)
BUN: 19 mg/dL (ref 6–23)
CO2: 30 meq/L (ref 19–32)
Calcium: 9.5 mg/dL (ref 8.4–10.5)
Chloride: 104 meq/L (ref 96–112)
Creatinine, Ser: 1.11 mg/dL (ref 0.40–1.50)
GFR: 69.76 mL/min (ref >60.00)
Glucose, Bld: 105 mg/dL — ABNORMAL HIGH (ref 70–99)
Potassium: 4.9 meq/L (ref 3.5–5.1)
Sodium: 141 meq/L (ref 135–145)
Total Bilirubin: 0.8 mg/dL (ref 0.2–1.2)
Total Protein: 7.3 g/dL (ref 6.0–8.3)

## 2023-04-03 LAB — TSH: TSH: 0.93 u[IU]/mL (ref 0.35–5.50)

## 2023-04-03 LAB — CBC WITH DIFFERENTIAL/PLATELET
Basophils Absolute: 0.1 10*3/uL (ref 0.0–0.1)
Basophils Relative: 2.4 % (ref 0.0–3.0)
Eosinophils Absolute: 0.2 10*3/uL (ref 0.0–0.7)
Eosinophils Relative: 3.9 % (ref 0.0–5.0)
HCT: 46.3 % (ref 39.0–52.0)
Hemoglobin: 15.8 g/dL (ref 13.0–17.0)
Lymphocytes Relative: 35.5 % (ref 12.0–46.0)
Lymphs Abs: 1.8 10*3/uL (ref 0.7–4.0)
MCHC: 34 g/dL (ref 30.0–36.0)
MCV: 90.6 fL (ref 78.0–100.0)
Monocytes Absolute: 0.6 10*3/uL (ref 0.1–1.0)
Monocytes Relative: 11.1 % (ref 3.0–12.0)
Neutro Abs: 2.4 10*3/uL (ref 1.4–7.7)
Neutrophils Relative %: 47.1 % (ref 43.0–77.0)
Platelets: 232 10*3/uL (ref 150.0–400.0)
RBC: 5.11 Mil/uL (ref 4.22–5.81)
RDW: 13.5 % (ref 11.5–15.5)
WBC: 5 10*3/uL (ref 4.0–10.5)

## 2023-04-03 LAB — URINALYSIS
Bilirubin Urine: NEGATIVE
Hgb urine dipstick: NEGATIVE
Ketones, ur: NEGATIVE
Leukocytes,Ua: NEGATIVE
Nitrite: NEGATIVE
Specific Gravity, Urine: 1.01 (ref 1.000–1.030)
Total Protein, Urine: NEGATIVE
Urine Glucose: NEGATIVE
Urobilinogen, UA: 0.2 (ref 0.0–1.0)
pH: 6.5 (ref 5.0–8.0)

## 2023-04-03 LAB — LIPID PANEL
Cholesterol: 216 mg/dL — ABNORMAL HIGH (ref 0–200)
HDL: 57.7 mg/dL (ref >39.00)
LDL Cholesterol: 147 mg/dL — ABNORMAL HIGH (ref 0–99)
NonHDL: 157.94
Total CHOL/HDL Ratio: 4
Triglycerides: 54 mg/dL (ref 0.0–149.0)
VLDL: 10.8 mg/dL (ref 0.0–40.0)

## 2023-04-03 LAB — VITAMIN B12: Vitamin B-12: 356 pg/mL (ref 211–911)

## 2023-04-03 LAB — VITAMIN D 25 HYDROXY (VIT D DEFICIENCY, FRACTURES): VITD: 29.13 ng/mL — ABNORMAL LOW (ref 30.00–100.00)

## 2023-04-03 MED ORDER — ESCITALOPRAM OXALATE 10 MG PO TABS: 10.0000 mg | ORAL_TABLET | Freq: Every day | ORAL | 3 refills | Status: AC

## 2023-04-03 MED ORDER — GLYCOPYRROLATE 2 MG PO TABS: 2.0000 mg | ORAL_TABLET | Freq: Three times a day (TID) | ORAL | 5 refills | Status: DC | PRN

## 2023-04-03 MED ORDER — FAMOTIDINE 40 MG PO TABS: 40.0000 mg | ORAL_TABLET | Freq: Every day | ORAL | 3 refills | Status: AC

## 2023-04-03 NOTE — Patient Instructions (Signed)

## 2023-04-03 NOTE — Assessment & Plan Note (Addendum)
We discussed age appropriate health related issues, including available/recomended screening tests and vaccinations. We discussed a need for adhering to healthy diet and exercise. Labs were ordered to be later reviewed . All questions were answered. Colon 2009 and 2020 Dr Marina Goodell, due 2030 Eye exam, Derm exam q 12 mo Dental q 6 mo  Coronary calcium score of 0. Taydon retired in [place] of 2024

## 2023-04-03 NOTE — Assessment & Plan Note (Signed)
Appt w/Dr Andrey Campanile pending On PPI bid, Famotidine

## 2023-04-03 NOTE — Progress Notes (Addendum)
Subjective:  Patient ID: Harry Levy, male    DOB: 08-18-1955  Age: 68 y.o. MRN: [id number]  CC: Annual Exam   HPI Harry Levy presents for a welcome to Medicare visit C/o GERD occ breakthrough sx's Samual retired in Summer of 2024   Outpatient Medications Prior to Visit  Medication Sig Dispense Refill   anastrozole (ARIMIDEX) 1 MG tablet Take 1 tablet (1 mg total) by mouth daily. 90 tablet 3   Cholecalciferol (VITAMIN D3) 2000 units capsule Take 1 capsule (2,000 Units total) by mouth daily. 100 capsule 3   Coenzyme Q10 (COQ10) 100 MG CAPS Take 1 capsule by mouth daily.     cyclobenzaprine (FLEXERIL) 10 MG tablet Take 1 tablet (10 mg total) by mouth 3 (three) times daily as needed-between meals & bedtime for muscle spasms. 30 tablet 3   gabapentin (NEURONTIN) 300 MG capsule Take 1 capsule (300 mg total) by mouth 3 (three) times daily. 90 capsule 3   hyoscyamine (LEVSIN SL) 0.125 MG SL tablet Take 1-2 by mouth 4 times a day prn 30 tablet 6   Multiple Vitamin (MULTIVITAMIN) tablet Take 1 tablet by mouth daily.     NEEDLE, DISP, 18 G (BD DISP NEEDLES) 18G X 1-1/2" MISC Use as directed 12 each PRN   Omega-3 Fatty Acids (FISH OIL) 1000 MG CAPS Take 1 capsule by mouth daily.     pantoprazole (PROTONIX) 40 MG tablet Take 1 tablet (40 mg total) by mouth 2 (two) times daily before a meal. 180 tablet 4   SYRINGE-NEEDLE, DISP, 3 ML (B-D 3CC LUER-LOK SYR 25GX1") 25G X 1" 3 ML MISC Use as directed 20 each 3   testosterone cypionate (DEPOTESTOSTERONE CYPIONATE) 200 MG/ML injection Inject 1 mL (200 mg total) into the muscle every 2 weeks. 10 mL 1   traMADol (ULTRAM) 50 MG tablet Take 1 tablet (50 mg total) by mouth every 6 (six) hours as needed. 20 tablet 1   vitamin C (ASCORBIC ACID) 500 MG tablet Take 500 mg by mouth daily.     escitalopram (LEXAPRO) 10 MG tablet Take 1 tablet (10 mg total) by mouth daily. 90 tablet 3   famotidine (PEPCID) 40 MG tablet Take 1 tablet (40 mg total) by mouth  daily. 90 tablet 3   glycopyrrolate (ROBINUL-FORTE) 2 MG tablet Take 1 tablet (2 mg total) by mouth 3 (three) times daily as needed. 90 tablet 5   anastrozole (ARIMIDEX) 1 MG tablet Take 1 tablet by mouth daily 90 tablet 3   tadalafil (CIALIS) 20 MG tablet Take 1 tablet (20 mg total) by mouth daily as needed for erectile dysfunction. 30 tablet 11   testosterone cypionate (DEPOTESTOSTERONE CYPIONATE) 200 MG/ML injection Inject 1 mL (200 mg total) into the muscle every 14 (fourteen) days. 10 mL 1   tizanidine (ZANAFLEX) 2 MG capsule Take 1-2 capsules (2-4 mg total) by mouth 3 (three) times daily as needed for muscle spasms. 180 capsule 1   No facility-administered medications prior to visit.    ROS: Review of Systems  Constitutional:  Negative for appetite change, fatigue and unexpected weight change.  HENT:  Negative for congestion, nosebleeds, sneezing, sore throat and trouble swallowing.   Eyes:  Negative for itching and visual disturbance.  Respiratory:  Negative for cough.   Cardiovascular:  Negative for chest pain, palpitations and leg swelling.  Gastrointestinal:  Negative for abdominal distention, blood in stool, diarrhea and nausea.  Genitourinary:  Negative for frequency and hematuria.  Musculoskeletal:  Positive  for arthralgias and back pain. Negative for gait problem, joint swelling and neck pain.  Skin:  Negative for rash.  Neurological:  Negative for dizziness, tremors, speech difficulty and weakness.  Psychiatric/Behavioral:  Negative for agitation, dysphoric mood and sleep disturbance. The patient is not nervous/anxious.     Objective:  BP 120/78 (BP Location: Right Arm, Patient Position: Sitting, Cuff Size: Normal)   Pulse (!) 58   Temp 98.6 F (37 C) (Oral)   Ht 6\' 5"  (1.956 m)   Wt 218 lb (98.9 kg)   SpO2 95%   BMI 25.85 kg/m   BP Readings from Last 3 Encounters:  04/03/23 120/78  03/15/22 128/71  01/27/20 132/84    Wt Readings from Last 3 Encounters:   04/03/23 218 lb (98.9 kg)  03/15/22 218 lb 6.4 oz (99.1 kg)  01/27/20 222 lb (100.7 kg)    Physical Exam Constitutional:      General: He is not in acute distress.    Appearance: He is well-developed.     Comments: NAD  Eyes:     Conjunctiva/sclera: Conjunctivae normal.     Pupils: Pupils are equal, round, and reactive to light.  Neck:     Thyroid: No thyromegaly.     Vascular: No JVD.  Cardiovascular:     Rate and Rhythm: Normal rate and regular rhythm.     Heart sounds: Normal heart sounds. No murmur heard.    No friction rub. No gallop.  Pulmonary:     Effort: Pulmonary effort is normal. No respiratory distress.     Breath sounds: Normal breath sounds. No wheezing or rales.  Chest:     Chest wall: No tenderness.  Abdominal:     General: Bowel sounds are normal. There is no distension.     Palpations: Abdomen is soft. There is no mass.     Tenderness: There is no abdominal tenderness. There is no guarding or rebound.  Musculoskeletal:        General: No tenderness. Normal range of motion.     Cervical back: Normal range of motion.  Lymphadenopathy:     Cervical: No cervical adenopathy.  Skin:    General: Skin is warm and dry.     Findings: No rash.  Neurological:     Mental Status: He is alert and oriented to person, place, and time.     Cranial Nerves: No cranial nerve deficit.     Motor: No abnormal muscle tone.     Coordination: Coordination normal.     Gait: Gait normal.     Deep Tendon Reflexes: Reflexes are normal and symmetric.  Psychiatric:        Behavior: Behavior normal.        Thought Content: Thought content normal.        Judgment: Judgment normal.   Rectal-Per Urology  Lab Results  Component Value Date   WBC 5.0 04/03/2023   HGB 15.8 04/03/2023   HCT 46.3 04/03/2023   PLT 232.0 04/03/2023   GLUCOSE 105 (H) 04/03/2023   CHOL 216 (H) 04/03/2023   TRIG 54.0 04/03/2023   HDL 57.70 04/03/2023   LDLCALC 147 (H) 04/03/2023   ALT 22 04/03/2023    AST 21 04/03/2023   NA 141 04/03/2023   K 4.9 04/03/2023   CL 104 04/03/2023   CREATININE 1.11 04/03/2023   BUN 19 04/03/2023   CO2 30 04/03/2023   TSH 0.93 04/03/2023   PSA 1.58 05/11/2020   HGBA1C 5.6 05/12/2020  No results found.  Assessment & Plan:   Problem List Items Addressed This Visit     Well adult exam - Primary   We discussed age appropriate health related issues, including available/recomended screening tests and vaccinations. We discussed a need for adhering to healthy diet and exercise. Labs were ordered to be later reviewed . All questions were answered. Colon 2009 and 2020 Dr Marina Goodell, due 2030 Eye exam, Derm exam q 12 mo Dental q 6 mo  Coronary calcium score of 0. Justo retired in Summer of 2024      Relevant Orders   TSH (Completed)   Urinalysis (Completed)   CBC with Differential/Platelet (Completed)   Lipid panel (Completed)   Comprehensive metabolic panel (Completed)   Vitamin B12 (Completed)   VITAMIN D 25 Hydroxy (Vit-D Deficiency, Fractures) (Completed)   Hiatal hernia   Appt w/Dr Andrey Campanile pending On PPI bid, Famotidine       Relevant Orders   Vitamin B12 (Completed)   VITAMIN D 25 Hydroxy (Vit-D Deficiency, Fractures) (Completed)   Osteoarthritis of both hands   B thumbs Blue-Emu cream was recommended to use 2-3 times a day      Other Visit Diagnoses       Need for pneumococcal vaccination       Relevant Orders   Pneumococcal conjugate vaccine 20-valent (Prevnar 20) (Completed)         Meds ordered this encounter  Medications   escitalopram (LEXAPRO) 10 MG tablet    Sig: Take 1 tablet (10 mg total) by mouth daily.    Dispense:  90 tablet    Refill:  3   famotidine (PEPCID) 40 MG tablet    Sig: Take 1 tablet (40 mg total) by mouth daily.    Dispense:  90 tablet    Refill:  3   glycopyrrolate (ROBINUL-FORTE) 2 MG tablet    Sig: Take 1 tablet (2 mg total) by mouth 3 (three) times daily as needed.    Dispense:  90 tablet     Refill:  5      Follow-up: Return in about 1 year (around 04/02/2024) for Wellness Exam.  Sonda Primes, MD

## 2023-04-03 NOTE — Assessment & Plan Note (Signed)
B thumbs Blue-Emu cream was recommended to use 2-3 times a day

## 2023-04-03 NOTE — Addendum Note (Signed)
Addended by: Delsa Grana R on: 04/03/2023 09:42 AM   Modules accepted: Orders

## 2023-04-05 ENCOUNTER — Encounter: Payer: Self-pay | Admitting: Internal Medicine

## 2023-04-07 ENCOUNTER — Encounter: Payer: Self-pay | Admitting: Internal Medicine

## 2023-04-07 NOTE — Addendum Note (Signed)
Addended by: Tresa Garter on: 04/07/2023 07:21 AM   Modules accepted: Level of Service

## 2023-04-07 NOTE — Addendum Note (Signed)
Addended by: Tresa Garter on: 04/07/2023 07:10 AM   Modules accepted: Level of Service

## 2023-04-09 NOTE — Consult Note (Signed)
Spoke with patient. He retired at the end of the year and will be emailing me copies of his Medicare card and supplement card. Once received, I will upload into the system.

## 2023-04-11 ENCOUNTER — Telehealth: Payer: Self-pay

## 2023-04-11 NOTE — Telephone Encounter (Signed)
error 

## 2023-04-11 NOTE — Telephone Encounter (Signed)
LVM for pt to call the office to set up a welcome to Medicare AWV with Dr. Posey Rea.

## 2023-04-14 ENCOUNTER — Other Ambulatory Visit (HOSPITAL_COMMUNITY): Payer: Self-pay

## 2023-06-12 ENCOUNTER — Encounter: Payer: Self-pay | Admitting: Internal Medicine

## 2023-06-23 ENCOUNTER — Other Ambulatory Visit: Payer: Self-pay | Admitting: Internal Medicine

## 2023-06-23 MED ORDER — TRAMADOL HCL 50 MG PO TABS: 50.0000 mg | ORAL_TABLET | Freq: Four times a day (QID) | ORAL | 1 refills | Status: DC | PRN

## 2023-06-24 NOTE — Progress Notes (Unsigned)
    Ben Jackson D.[facility] [address] [place] [address] Phone: [phone]   Assessment and Plan:     There are no diagnoses linked to this encounter.  ***   Pertinent previous records reviewed include ***    Follow Up: ***     Subjective:   I, Harry Levy, am serving as a Neurosurgeon for Doctor Ulysees Gander  Chief Complaint: right rib pain   HPI:   06/25/2023 Patient is a 68 year old male with right rib pain. Patient states   Relevant Historical Information: ***  Additional pertinent review of systems negative.   Current Outpatient Medications:    anastrozole (ARIMIDEX) 1 MG tablet, Take 1 tablet (1 mg total) by mouth daily., Disp: 90 tablet, Rfl: 3   Cholecalciferol (VITAMIN D3) 2000 units capsule, Take 1 capsule (2,000 Units total) by mouth daily., Disp: 100 capsule, Rfl: 3   Coenzyme Q10 (COQ10) 100 MG CAPS, Take 1 capsule by mouth daily., Disp: , Rfl:    cyclobenzaprine (FLEXERIL) 10 MG tablet, Take 1 tablet (10 mg total) by mouth 3 (three) times daily as needed-between meals & bedtime for muscle spasms., Disp: 30 tablet, Rfl: 3   escitalopram (LEXAPRO) 10 MG tablet, Take 1 tablet (10 mg total) by mouth daily., Disp: 90 tablet, Rfl: 3   famotidine (PEPCID) 40 MG tablet, Take 1 tablet (40 mg total) by mouth daily., Disp: 90 tablet, Rfl: 3   gabapentin (NEURONTIN) 300 MG capsule, Take 1 capsule (300 mg total) by mouth 3 (three) times daily., Disp: 90 capsule, Rfl: 3   glycopyrrolate (ROBINUL-FORTE) 2 MG tablet, Take 1 tablet (2 mg total) by mouth 3 (three) times daily as needed., Disp: 90 tablet, Rfl: 5   hyoscyamine (LEVSIN SL) 0.125 MG SL tablet, Take 1-2 by mouth 4 times a day prn, Disp: 30 tablet, Rfl: 6   Multiple Vitamin (MULTIVITAMIN) tablet, Take 1 tablet by mouth daily., Disp: , Rfl:    NEEDLE, DISP, 18 G (BD DISP NEEDLES) 18G X 1-1/2" MISC, Use as directed, Disp: 12 each, Rfl: PRN   Omega-3 Fatty Acids (FISH  OIL) 1000 MG CAPS, Take 1 capsule by mouth daily., Disp: , Rfl:    pantoprazole (PROTONIX) 40 MG tablet, Take 1 tablet (40 mg total) by mouth 2 (two) times daily before a meal., Disp: 180 tablet, Rfl: 4   SYRINGE-NEEDLE, DISP, 3 ML (B-D 3CC LUER-LOK SYR 25GX1") 25G X 1" 3 ML MISC, Use as directed, Disp: 20 each, Rfl: 3   testosterone cypionate (DEPOTESTOSTERONE CYPIONATE) 200 MG/ML injection, Inject 1 mL (200 mg total) into the muscle every 2 weeks., Disp: 10 mL, Rfl: 1   traMADol (ULTRAM) 50 MG tablet, Take 1 tablet (50 mg total) by mouth every 6 (six) hours as needed., Disp: 60 tablet, Rfl: 1   vitamin C (ASCORBIC ACID) 500 MG tablet, Take 500 mg by mouth daily., Disp: , Rfl:    Objective:     There were no vitals filed for this visit.    There is no height or weight on file to calculate BMI.    Physical Exam:    ***   Electronically signed by:  Marshall Skeeter D.[facility] 7:48 AM 06/24/23

## 2023-06-25 ENCOUNTER — Ambulatory Visit (INDEPENDENT_AMBULATORY_CARE_PROVIDER_SITE_OTHER)

## 2023-06-25 ENCOUNTER — Ambulatory Visit (INDEPENDENT_AMBULATORY_CARE_PROVIDER_SITE_OTHER): Admitting: Sports Medicine

## 2023-06-25 VITALS — BP 118/76 | HR 76 | Ht 77.0 in | Wt 212.0 lb

## 2023-06-25 DIAGNOSIS — R0781 Pleurodynia: Secondary | ICD-10-CM

## 2023-06-25 DIAGNOSIS — S2241XA Multiple fractures of ribs, right side, initial encounter for closed fracture: Secondary | ICD-10-CM

## 2023-06-25 NOTE — Patient Instructions (Signed)
 Tylenol [phone] mg 2-3 times a day for pain relief  NSAID's as needed for breakthrough pain  Tramadol as needed for severe breakthrough pain  Suspect 6-10 weeks for slow healing Activity as tolerated  As needed follow up

## 2023-07-11 ENCOUNTER — Encounter: Payer: Self-pay | Admitting: Family Medicine

## 2023-07-11 ENCOUNTER — Other Ambulatory Visit: Payer: Self-pay

## 2023-07-11 ENCOUNTER — Ambulatory Visit (INDEPENDENT_AMBULATORY_CARE_PROVIDER_SITE_OTHER): Admitting: Family Medicine

## 2023-07-11 VITALS — BP 106/68 | HR 62 | Ht 77.0 in | Wt 217.0 lb

## 2023-07-11 DIAGNOSIS — M76892 Other specified enthesopathies of left lower limb, excluding foot: Secondary | ICD-10-CM

## 2023-07-11 DIAGNOSIS — M25562 Pain in left knee: Secondary | ICD-10-CM

## 2023-07-11 MED ORDER — MELOXICAM 15 MG PO TABS: 15.0000 mg | ORAL_TABLET | Freq: Every day | ORAL | 0 refills | Status: DC

## 2023-07-11 NOTE — Assessment & Plan Note (Signed)
 Popliteal tendinitis with questionable actually posterior medial meniscal injury.  Difficult to assess overall.  We discussed with patient different treatment options.  Will do meloxicam 15 mg daily for 10 days then as needed.  Discussed icing regimen and home exercises, discussed which activities to do and which ones to avoid.  Increase activity slowly.  Discussed icing regimen.  Worsening pain consider injection.

## 2023-07-11 NOTE — Progress Notes (Signed)
 [facility] [address] [place] [address] Phone: [phone] Subjective:   IBryan Caprio, am serving as a scribe for Dr. Ronnell Coins.  I'm seeing this patient by the request  of:  Plotnikov, Oakley Bellman, MD  CC: left knee pain follow up   [id number]  Harry Levy is a 68 y.o. male coming in with complaint of L knee injury. Patient states was playing pickleball initially.  Felt pain afterwards.  Has had intermittent knee pain previously but now this became significantly worse and on the lateral aspect of the knee.  Radiation down into the calf some.      Past Medical History:  Diagnosis Date   Hypogonadism in male    Sciatica, left side    Past Surgical History:  Procedure Laterality Date   ESOPHAGEAL MANOMETRY N/A 12/04/2022   Procedure: ESOPHAGEAL MANOMETRY (EM);  Surgeon: Annis Kinder, Harry Levy;  Location: [facility];  Service: Gastroenterology;  Laterality: N/A;   LUMBAR MICRODISCECTOMY  2005   Dr. Adonis Alamin    Social History   Socioeconomic History   Marital status: Divorced    Spouse name: Not on file   Number of children: 4   Years of education: Not on file   Highest education level: Professional school degree (e.g., MD, DDS, DVM, JD)  Occupational History   Occupation: medical doctor  Tobacco Use   Smoking status: Never   Smokeless tobacco: Never  Substance and Sexual Activity   Alcohol use: Yes    Alcohol/week: 3.0 standard drinks of alcohol    Types: 3 Glasses of wine per week   Drug use: No   Sexual activity: Not on file  Other Topics Concern   Not on file  Social History Narrative   Not on file   Social Drivers of Health   Financial Resource Strain: Low Risk  (04/01/2023)   Overall Financial Resource Strain (CARDIA)    Difficulty of Paying Living Expenses: Not hard at all  Food Insecurity: No Food Insecurity (04/01/2023)   Hunger Vital Sign    Worried About Running Out of Food in the Last Year: Never true     Ran Out of Food in the Last Year: Never true  Transportation Needs: No Transportation Needs (04/01/2023)   PRAPARE - Administrator, Civil Service (Medical): No    Lack of Transportation (Non-Medical): No  Physical Activity: Sufficiently Active (04/01/2023)   Exercise Vital Sign    Days of Exercise per Week: 4 days    Minutes of Exercise per Session: 40 min  Stress: No Stress Concern Present (04/01/2023)   [employer] of Occupational Health - Occupational Stress Questionnaire    Feeling of Stress : Only a little  Social Connections: Moderately Integrated (04/01/2023)   Social Connection and Isolation Panel [NHANES]    Frequency of Communication with Friends and Family: More than three times a week    Frequency of Social Gatherings with Friends and Family: More than three times a week    Attends Religious Services: More than 4 times per year    Active Member of [employer] or Organizations: Yes    Attends Engineer, structural: More than 4 times per year    Marital Status: Divorced   No Known Allergies Family History  Problem Relation Age of Onset   Atrial fibrillation Mother    Esophageal cancer Father 58       esoph ca   Colon cancer Maternal Grandmother  Colon cancer Paternal Grandmother     Current Outpatient Medications (Endocrine & Metabolic):    testosterone  cypionate (DEPOTESTOSTERONE CYPIONATE) 200 MG/ML injection, Inject 1 mL (200 mg total) into the muscle every 2 weeks.    Current Outpatient Medications (Analgesics):    meloxicam (MOBIC) 15 MG tablet, Take 1 tablet (15 mg total) by mouth daily.   traMADol  (ULTRAM ) 50 MG tablet, Take 1 tablet (50 mg total) by mouth every 6 (six) hours as needed.   Current Outpatient Medications (Other):    anastrozole  (ARIMIDEX ) 1 MG tablet, Take 1 tablet (1 mg total) by mouth daily.   Cholecalciferol (VITAMIN D3) 2000 units capsule, Take 1 capsule (2,000 Units total) by mouth daily.   Coenzyme Q10 (COQ10) 100  MG CAPS, Take 1 capsule by mouth daily.   cyclobenzaprine  (FLEXERIL ) 10 MG tablet, Take 1 tablet (10 mg total) by mouth 3 (three) times daily as needed-between meals & bedtime for muscle spasms.   escitalopram  (LEXAPRO ) 10 MG tablet, Take 1 tablet (10 mg total) by mouth daily.   famotidine  (PEPCID ) 40 MG tablet, Take 1 tablet (40 mg total) by mouth daily.   gabapentin  (NEURONTIN ) 300 MG capsule, Take 1 capsule (300 mg total) by mouth 3 (three) times daily.   glycopyrrolate  (ROBINUL -FORTE) 2 MG tablet, Take 1 tablet (2 mg total) by mouth 3 (three) times daily as needed.   hyoscyamine  (LEVSIN SL) 0.125 MG SL tablet, Take 1-2 by mouth 4 times a day prn   Multiple Vitamin (MULTIVITAMIN) tablet, Take 1 tablet by mouth daily.   NEEDLE, DISP, 18 G (BD DISP NEEDLES) 18G X 1-1/2" MISC, Use as directed   Omega-3 Fatty Acids (FISH OIL) 1000 MG CAPS, Take 1 capsule by mouth daily.   pantoprazole  (PROTONIX ) 40 MG tablet, Take 1 tablet (40 mg total) by mouth 2 (two) times daily before a meal.   SYRINGE-NEEDLE, DISP, 3 ML (B-D 3CC LUER-LOK SYR 25GX1") 25G X 1" 3 ML MISC, Use as directed   vitamin C (ASCORBIC ACID) 500 MG tablet, Take 500 mg by mouth daily.   Reviewed prior external information including notes and imaging from  primary care provider As well as notes that were available from care everywhere and other healthcare systems.  Past medical history, social, surgical and family history all reviewed in electronic medical record.  No pertanent information unless stated regarding to the chief complaint.   Review of Systems:  No headache, visual changes, nausea, vomiting, diarrhea, constipation, dizziness, abdominal pain, skin rash, fevers, chills, night sweats, weight loss, swollen lymph nodes, body aches, joint swelling, chest pain, shortness of breath, mood changes. POSITIVE muscle aches  Objective  Blood pressure 106/68, pulse 62, height 6\' 5"  (1.956 m), weight 217 lb (98.4 kg), SpO2 97%.    General: No apparent distress alert and oriented x3 mood and affect normal, dressed appropriately.  HEENT: Pupils equal, extraocular movements intact  Respiratory: Patient's speak in full sentences and does not appear short of breath  Cardiovascular: No lower extremity edema, non tender, no erythema   Knee exam shows trace amount of swelling noted.  Tender to palpation over both the medial and lateral joint space.  Positive McMurray's noted.  Worsening pain with extension of the knee.  Limited muscular skeletal ultrasound was performed and interpreted by Ronnell Coins, M  Limited ultrasound shows some hypoechoic changes mostly around the popliteal tendon.  Lateral meniscus does not have any true tear noted.  Some degenerative tearing and some very mild displacement noted of the  posterior medial meniscus.   Impression and Recommendations:     The above documentation has been reviewed and is accurate and complete Harry Mclaren M Kiandre Spagnolo, Harry Levy

## 2023-07-11 NOTE — Patient Instructions (Signed)
 Meloxicam daily for 10 days then as needed Ice Compression sleeve See me in 5-6 weeks

## 2023-08-10 ENCOUNTER — Other Ambulatory Visit: Payer: Self-pay | Admitting: Family Medicine

## 2023-08-19 NOTE — Progress Notes (Unsigned)
 [facility] [address] [place] [address] Phone: [phone] Subjective:   Harry Levy, am serving as a scribe for Dr. Ronnell Coins.  I'm seeing this patient by the request  of:  Plotnikov, Oakley Bellman, MD  CC: Left knee pain  [id number]  07/11/2023 Popliteal tendinitis with questionable actually posterior medial meniscal injury.  Difficult to assess overall.  We discussed with patient different treatment options.  Will do meloxicam  15 mg daily for 10 days then as needed.  Discussed icing regimen and home exercises, discussed which activities to do and which ones to avoid.  Increase activity slowly.  Discussed icing regimen.  Worsening pain consider injection.      Update 08/20/2023 Harry Levy is a 68 y.o. male coming in with complaint of L knee pain.  Found to have more of a popliteal tendinitis on the left knee.  Given meloxicam , was to do compression sleeve and exercises.  Patient states that he has made improvement. Has not returned to full activity. Biking, elliptical and walking with light resistance. Continues to have ain over lateral aspect and over VMO. Pain over politeal tendon has improved.       Past Medical History:  Diagnosis Date   Hypogonadism in male    Sciatica, left side    Past Surgical History:  Procedure Laterality Date   ESOPHAGEAL MANOMETRY N/A 12/04/2022   Procedure: ESOPHAGEAL MANOMETRY (EM);  Surgeon: Annis Kinder, DO;  Location: [facility];  Service: Gastroenterology;  Laterality: N/A;   LUMBAR MICRODISCECTOMY  2005   Dr. Adonis Alamin    Social History   Socioeconomic History   Marital status: Divorced    Spouse name: Not on file   Number of children: 4   Years of education: Not on file   Highest education level: Professional school degree (e.g., MD, DDS, DVM, JD)  Occupational History   Occupation: medical doctor  Tobacco Use   Smoking status: Never   Smokeless tobacco: Never  Substance and  Sexual Activity   Alcohol use: Yes    Alcohol/week: 3.0 standard drinks of alcohol    Types: 3 Glasses of wine per week   Drug use: No   Sexual activity: Not on file  Other Topics Concern   Not on file  Social History Narrative   Not on file   Social Drivers of Health   Financial Resource Strain: Low Risk  (04/01/2023)   Overall Financial Resource Strain (CARDIA)    Difficulty of Paying Living Expenses: Not hard at all  Food Insecurity: No Food Insecurity (04/01/2023)   Hunger Vital Sign    Worried About Running Out of Food in the Last Year: Never true    Ran Out of Food in the Last Year: Never true  Transportation Needs: No Transportation Needs (04/01/2023)   PRAPARE - Administrator, Civil Service (Medical): No    Lack of Transportation (Non-Medical): No  Physical Activity: Sufficiently Active (04/01/2023)   Exercise Vital Sign    Days of Exercise per Week: 4 days    Minutes of Exercise per Session: 40 min  Stress: No Stress Concern Present (04/01/2023)   [employer] of Occupational Health - Occupational Stress Questionnaire    Feeling of Stress : Only a little  Social Connections: Moderately Integrated (04/01/2023)   Social Connection and Isolation Panel [NHANES]    Frequency of Communication with Friends and Family: More than three times a week    Frequency of Social  Gatherings with Friends and Family: More than three times a week    Attends Religious Services: More than 4 times per year    Active Member of Clubs or Organizations: Yes    Attends Engineer, structural: More than 4 times per year    Marital Status: Divorced   No Known Allergies Family History  Problem Relation Age of Onset   Atrial fibrillation Mother    Esophageal cancer Father 8       esoph ca   Colon cancer Maternal Grandmother    Colon cancer Paternal Grandmother     Current Outpatient Medications (Endocrine & Metabolic):    testosterone  cypionate (DEPOTESTOSTERONE  CYPIONATE) 200 MG/ML injection, Inject 1 mL (200 mg total) into the muscle every 2 weeks.    Current Outpatient Medications (Analgesics):    meloxicam  (MOBIC ) 15 MG tablet, TAKE 1 TABLET(15 MG) BY MOUTH DAILY   traMADol  (ULTRAM ) 50 MG tablet, Take 1 tablet (50 mg total) by mouth every 6 (six) hours as needed.   Current Outpatient Medications (Other):    anastrozole  (ARIMIDEX ) 1 MG tablet, Take 1 tablet (1 mg total) by mouth daily.   Cholecalciferol (VITAMIN D3) 2000 units capsule, Take 1 capsule (2,000 Units total) by mouth daily.   Coenzyme Q10 (COQ10) 100 MG CAPS, Take 1 capsule by mouth daily.   cyclobenzaprine  (FLEXERIL ) 10 MG tablet, Take 1 tablet (10 mg total) by mouth 3 (three) times daily as needed-between meals & bedtime for muscle spasms.   escitalopram  (LEXAPRO ) 10 MG tablet, Take 1 tablet (10 mg total) by mouth daily.   famotidine  (PEPCID ) 40 MG tablet, Take 1 tablet (40 mg total) by mouth daily.   gabapentin  (NEURONTIN ) 300 MG capsule, Take 1 capsule (300 mg total) by mouth 3 (three) times daily.   glycopyrrolate  (ROBINUL -FORTE) 2 MG tablet, Take 1 tablet (2 mg total) by mouth 3 (three) times daily as needed.   hyoscyamine  (LEVSIN SL) 0.125 MG SL tablet, Take 1-2 by mouth 4 times a day prn   Multiple Vitamin (MULTIVITAMIN) tablet, Take 1 tablet by mouth daily.   NEEDLE, DISP, 18 G (BD DISP NEEDLES) 18G X 1-1/2 MISC, Use as directed   Omega-3 Fatty Acids (FISH OIL) 1000 MG CAPS, Take 1 capsule by mouth daily.   pantoprazole  (PROTONIX ) 40 MG tablet, Take 1 tablet (40 mg total) by mouth 2 (two) times daily before a meal.   SYRINGE-NEEDLE, DISP, 3 ML (B-D 3CC LUER-LOK SYR 25GX1) 25G X 1 3 ML MISC, Use as directed   vitamin C (ASCORBIC ACID) 500 MG tablet, Take 500 mg by mouth daily.   Reviewed prior external information including notes and imaging from  primary care provider As well as notes that were available from care everywhere and other healthcare systems.  Past  medical history, social, surgical and family history all reviewed in electronic medical record.  No pertanent information unless stated regarding to the chief complaint.   Review of Systems:  No headache, visual changes, nausea, vomiting, diarrhea, constipation, dizziness, abdominal pain, skin rash, fevers, chills, night sweats, weight loss, swollen lymph nodes, body aches, joint swelling, chest pain, shortness of breath, mood changes. POSITIVE muscle aches  Objective  There were no vitals taken for this visit.   General: No apparent distress alert and oriented x3 mood and affect normal, dressed appropriately.  HEENT: Pupils equal, extraocular movements intact  Respiratory: Patient's speak in full sentences and does not appear short of breath  Cardiovascular: No lower extremity edema, non  tender, no erythema  Left knee exam shows trace effusion noted compared to the contralateral side.  No significant instability of the knee.  Nontender over the popliteal tendon at this time.   Limited muscular skeletal ultrasound was performed and interpreted by Ronnell Coins, M  Limited ultrasound shows hypoechoic changes still noted in the patellofemoral joint and seems to be worsening from previous exam.  Patient's popliteal tendon seems to be significantly improved from previous exam. Impression: Patellofemoral effusion with some mild narrowing of the medial joint space and patellofemoral.   Impression and Recommendations:     The above documentation has been reviewed and is accurate and complete Takako Minckler M Romyn Boswell, DO

## 2023-08-20 ENCOUNTER — Encounter: Payer: Self-pay | Admitting: Family Medicine

## 2023-08-20 ENCOUNTER — Ambulatory Visit: Admitting: Family Medicine

## 2023-08-20 ENCOUNTER — Other Ambulatory Visit: Payer: Self-pay

## 2023-08-20 VITALS — BP 108/78 | HR 54 | Ht 77.0 in | Wt 221.0 lb

## 2023-08-20 DIAGNOSIS — M76892 Other specified enthesopathies of left lower limb, excluding foot: Secondary | ICD-10-CM | POA: Diagnosis not present

## 2023-08-20 DIAGNOSIS — G8929 Other chronic pain: Secondary | ICD-10-CM | POA: Diagnosis not present

## 2023-08-20 DIAGNOSIS — M25562 Pain in left knee: Secondary | ICD-10-CM | POA: Diagnosis not present

## 2023-08-20 NOTE — Assessment & Plan Note (Signed)
 Appears to be resolved.  Unfortunately does have some arthritic changes noted, I do think it is more on the patellofemoral with some hypoechoic changes noted on ultrasound today.  VMO strengthening that I think will be more beneficial.  Discussed icing regimen of home exercises,  His discussed avoiding certain activities.  Discussed repetitive activities that could potentially aggravate it.  Worsening pain will need to consider the possibility of injections or advanced imaging.  Follow-up again in 6 to 8 weeks.

## 2023-08-20 NOTE — Patient Instructions (Signed)
 Great to see you  Another 10 day burst of meloxicam  and then as needed.  Ice 20 minutes 2 times daily. Usually after activity and before bed. See me again in 5-6 weeks

## 2023-08-23 ENCOUNTER — Other Ambulatory Visit: Payer: Self-pay | Admitting: Internal Medicine

## 2023-10-01 NOTE — Progress Notes (Unsigned)
 [facility] [address] [place] [address] Phone: [phone] Subjective:   Harry Levy, am serving as a scribe for Dr. Arthea Claudene.  I'm seeing this patient by the request  of:  Plotnikov, Karlynn GAILS, MD  CC: left knee   YEP:Dlagzrupcz  08/20/2023   Appears to be resolved.  Unfortunately does have some arthritic changes noted, I do think it is more on the patellofemoral with some hypoechoic changes noted on ultrasound today.  VMO strengthening that I think will be more beneficial.  Discussed icing regimen of home exercises,  His discussed avoiding certain activities.  Discussed repetitive activities that could potentially aggravate it.  Worsening pain will need to consider the possibility of injections or advanced imaging.  Follow-up again in 6 to 8 weeks.     Update 10/02/2023 Harry Levy is a 68 y.o. male coming in with complaint of L knee pain.  Has been responding extremely well to conservative therapy for more of a pop tendon.  Did have some underlying arthritic changes noted.  Patient states that he is doing much better. Both knees hurt in joint space with exercise. Wants to discuss maintenance.       Past Medical History:  Diagnosis Date   Hypogonadism in male    Sciatica, left side    Past Surgical History:  Procedure Laterality Date   ESOPHAGEAL MANOMETRY N/A 12/04/2022   Procedure: ESOPHAGEAL MANOMETRY (EM);  Surgeon: San Sandor GAILS, DO;  Location: [facility];  Service: Gastroenterology;  Laterality: N/A;   LUMBAR MICRODISCECTOMY  2005   Dr. Louis    Social History   Socioeconomic History   Marital status: Divorced    Spouse name: Not on file   Number of children: 4   Years of education: Not on file   Highest education level: Professional school degree (e.g., MD, DDS, DVM, JD)  Occupational History   Occupation: medical doctor  Tobacco Use   Smoking status: Never   Smokeless tobacco: Never  Substance and  Sexual Activity   Alcohol use: Yes    Alcohol/week: 3.0 standard drinks of alcohol    Types: 3 Glasses of wine per week   Drug use: No   Sexual activity: Not on file  Other Topics Concern   Not on file  Social History Narrative   Not on file   Social Drivers of Health   Financial Resource Strain: Low Risk  (04/01/2023)   Overall Financial Resource Strain (CARDIA)    Difficulty of Paying Living Expenses: Not hard at all  Food Insecurity: No Food Insecurity (04/01/2023)   Hunger Vital Sign    Worried About Running Out of Food in the Last Year: Never true    Ran Out of Food in the Last Year: Never true  Transportation Needs: No Transportation Needs (04/01/2023)   PRAPARE - Administrator, Civil Service (Medical): No    Lack of Transportation (Non-Medical): No  Physical Activity: Sufficiently Active (04/01/2023)   Exercise Vital Sign    Days of Exercise per Week: 4 days    Minutes of Exercise per Session: 40 min  Stress: No Stress Concern Present (04/01/2023)   [employer] of Occupational Health - Occupational Stress Questionnaire    Feeling of Stress : Only a little  Social Connections: Moderately Integrated (04/01/2023)   Social Connection and Isolation Panel    Frequency of Communication with Friends and Family: More than three times a week    Frequency  of Social Gatherings with Friends and Family: More than three times a week    Attends Religious Services: More than 4 times per year    Active Member of [employer] or Organizations: Yes    Attends Engineer, structural: More than 4 times per year    Marital Status: Divorced   No Known Allergies Family History  Problem Relation Age of Onset   Atrial fibrillation Mother    Esophageal cancer Father 44       esoph ca   Colon cancer Maternal Grandmother    Colon cancer Paternal Grandmother     Current Outpatient Medications (Endocrine & Metabolic):    testosterone  cypionate (DEPOTESTOSTERONE CYPIONATE) 200  MG/ML injection, Inject 1 mL (200 mg total) into the muscle every 2 weeks.    Current Outpatient Medications (Analgesics):    meloxicam  (MOBIC ) 15 MG tablet, TAKE 1 TABLET(15 MG) BY MOUTH DAILY   traMADol  (ULTRAM ) 50 MG tablet, Take 1 tablet (50 mg total) by mouth every 6 (six) hours as needed.   Current Outpatient Medications (Other):    anastrozole  (ARIMIDEX ) 1 MG tablet, Take 1 tablet (1 mg total) by mouth daily.   Cholecalciferol (VITAMIN D3) 2000 units capsule, Take 1 capsule (2,000 Units total) by mouth daily.   Coenzyme Q10 (COQ10) 100 MG CAPS, Take 1 capsule by mouth daily.   cyclobenzaprine  (FLEXERIL ) 10 MG tablet, Take 1 tablet (10 mg total) by mouth 3 (three) times daily as needed-between meals & bedtime for muscle spasms.   famotidine  (PEPCID ) 40 MG tablet, Take 1 tablet (40 mg total) by mouth daily.   gabapentin  (NEURONTIN ) 300 MG capsule, TAKE ONE CAPSULE BY MOUTH THREE TIMES DAILY   glycopyrrolate  (ROBINUL -FORTE) 2 MG tablet, Take 1 tablet (2 mg total) by mouth 3 (three) times daily as needed.   hyoscyamine  (LEVSIN  SL) 0.125 MG SL tablet, Take 1-2 by mouth 4 times a day prn   Multiple Vitamin (MULTIVITAMIN) tablet, Take 1 tablet by mouth daily.   NEEDLE, DISP, 18 G (BD DISP NEEDLES) 18G X 1-1/2 MISC, Use as directed   Omega-3 Fatty Acids (FISH OIL) 1000 MG CAPS, Take 1 capsule by mouth daily.   pantoprazole  (PROTONIX ) 40 MG tablet, Take 1 tablet (40 mg total) by mouth 2 (two) times daily before a meal.   SYRINGE-NEEDLE, DISP, 3 ML (B-D 3CC LUER-LOK SYR 25GX1) 25G X 1 3 ML MISC, Use as directed   vitamin C (ASCORBIC ACID) 500 MG tablet, Take 500 mg by mouth daily.   escitalopram  (LEXAPRO ) 10 MG tablet, Take 1 tablet (10 mg total) by mouth daily.     Objective  Blood pressure 108/74, height 6' 5 (1.956 m), weight 218 lb (98.9 kg), SpO2 98%.   General: No apparent distress alert and oriented x3 mood and affect normal, dressed appropriately.  HEENT: Pupils equal,  extraocular movements intact  Respiratory: Patient's speak in full sentences and does not appear short of breath  Cardiovascular: No lower extremity edema, non tender, no erythema  Knee exam shows no significant effusion noted.  No significant improvement in range of motion.  Nontender on exam today.    Impression and Recommendations:    The above documentation has been reviewed and is accurate and complete Kaniesha Barile M Jacquetta Polhamus, DO

## 2023-10-02 ENCOUNTER — Ambulatory Visit: Admitting: Family Medicine

## 2023-10-02 ENCOUNTER — Other Ambulatory Visit: Payer: Self-pay

## 2023-10-02 ENCOUNTER — Encounter: Payer: Self-pay | Admitting: Family Medicine

## 2023-10-02 VITALS — BP 108/74 | Ht 77.0 in | Wt 218.0 lb

## 2023-10-02 DIAGNOSIS — M25562 Pain in left knee: Secondary | ICD-10-CM

## 2023-10-02 NOTE — Patient Instructions (Signed)
 Glucosamine 1500mg  daily Turmeric 500mg  2x daily Vit D 2000mg  daily Compression with racquet sports See me when you need me

## 2023-10-02 NOTE — Assessment & Plan Note (Signed)
 Seems to be resolved.  Very mild arthritic changes.  Discussed that worsening pain consider advanced imaging but I do think patient will do well.  Increase activity slowly.  Follow-up again as needed.  Discussed different ways to prevent progression of the arthritis today.

## 2023-10-10 ENCOUNTER — Telehealth: Payer: Self-pay | Admitting: Internal Medicine

## 2023-10-10 NOTE — Telephone Encounter (Signed)
 Copied from CRM #[id number]. Topic: General - Other >> Oct 09, 2023  4:51 PM Jasmin G wrote: Reason for CRM: Paramed from [employer] will fax over needed documents to be filled out, please fax over at your earliest convinience

## 2023-11-25 ENCOUNTER — Other Ambulatory Visit: Payer: Self-pay | Admitting: Family Medicine

## 2023-12-21 ENCOUNTER — Other Ambulatory Visit: Payer: Self-pay | Admitting: Family Medicine

## 2023-12-27 ENCOUNTER — Other Ambulatory Visit: Payer: Self-pay | Admitting: Internal Medicine

## 2024-01-24 ENCOUNTER — Other Ambulatory Visit: Payer: Self-pay | Admitting: Family Medicine

## 2024-01-24 ENCOUNTER — Other Ambulatory Visit: Payer: Self-pay | Admitting: Internal Medicine

## 2024-02-24 ENCOUNTER — Other Ambulatory Visit: Payer: Self-pay | Admitting: Family Medicine

## 2024-03-05 ENCOUNTER — Encounter: Payer: Self-pay | Admitting: Internal Medicine

## 2024-03-10 NOTE — Progress Notes (Signed)
 Sent message, via epic in basket, requesting orders in epic from Careers adviser.

## 2024-03-14 NOTE — Progress Notes (Signed)
 COVID Vaccine received:  []  No [x]  Yes Date of any COVID positive Test in last 90 days:  PCP - Karlynn Noel, MD  Cardiologist -   Chest x-ray -  EKG -  no history Stress Test -  ECHO -  Cardiac Cath -  CT Coronary Calcium score: 0 on 02-21-2020 Epic  Pacemaker / ICD device []  No []  Yes   Spinal Cord Stimulator:[]  No []  Yes       History of Sleep Apnea? []  No []  Yes   CPAP used?- []  No []  Yes    Medication on DOS: Pantoprazole , Famotidine , Anastrozole ,  Escitalopram , Gabapentin  and Tramadol  prn  Hold DOS:  Patient has: [x]  NO Hx DM   []  Pre-DM   []  DM1  []   DM2 Does the patient monitor blood sugar?   [x]  N/A   []  No []  Yes  Last A1c was: 5.6 on  05-12-2020  Epic     Blood Thinner / Instructions: none Aspirin Instructions:  none  Activity level: Able to walk up 2 flights of stairs without becoming significantly short of breath or having chest pain?   [x]    Yes   []  No,  would have:  Patient can perform ADLs without assistance.  []   Yes    []  No   Comments: RETIRED DOCTOR  Anesthesia review: GERD, Paresthesias,   Patient denies any S&S of respiratory illness or Covid - no shortness of breath, fever, cough or chest pain at PAT appointment.  Patient verbalized understanding and agreement to the Pre-Surgical Instructions that were given to them at this PAT appointment. Patient was also educated of the need to review these PAT instructions again prior to his/her surgery.I reviewed the appropriate phone numbers to call if they have any and questions or concerns.

## 2024-03-14 NOTE — Patient Instructions (Signed)
 SURGICAL WAITING ROOM VISITATION Patients having surgery or a procedure may have no more than 2 support people in the waiting area - these visitors may rotate in the visitor waiting room.   If the patient needs to stay at the hospital during part of their recovery, the visitor guidelines for inpatient rooms apply.  PRE-OP VISITATION  Pre-op nurse will coordinate an appropriate time for 1 support person to accompany the patient in pre-op.  This support person may not rotate.  This visitor will be contacted when the time is appropriate for the visitor to come back in the pre-op area.  To keep our patients, visitors and teammates safe and prevent the spread of respiratory illnesses over the next few months.  Temporary Visitor Restrictions  Children ages 83 and under will not be able to visit patients in [facility] under most circumstances. Visitation is not restricted outside of hospitals unless noted otherwise in the [facility] and Location Specific Visitation Guidelines at:       [email or web address]  Visitors with respiratory illnesses are discouraged from visiting and should remain at home. You are not required to quarantine at this time prior to your surgery. However, you must do this: Hand Hygiene often Do NOT share personal items Notify your provider if you are in close contact with someone who has COVID or you develop fever 100.4 or greater, new onset of sneezing, cough, sore throat, shortness of breath or body aches.  If you test positive for Covid or have been in contact with anyone that has tested positive in the last 10 days please notify you surgeon.    Your procedure is scheduled on: Tuesday 03-23-24   Report to [facility] Main Entrance: [address] entrance where the [employer] is available.   Report to admitting at: 0515    AM  Call this number if you have any questions or problems the morning of surgery [phone]  DO NOT EAT OR DRINK ANYTHING AFTER  MIDNIGHT THE NIGHT PRIOR TO YOUR SURGERY / PROCEDURE.   FOLLOW  ANY ADDITIONAL PRE OP INSTRUCTIONS YOU RECEIVED FROM YOUR SURGEON'S OFFICE!!!   Oral Hygiene is also important to reduce your risk of infection.        Remember - BRUSH YOUR TEETH THE MORNING OF SURGERY WITH YOUR REGULAR TOOTHPASTE  Do NOT smoke after Midnight the night before surgery.  STOP TAKING all Vitamins, Herbs and supplements 1 week before your surgery.   Take ONLY these medicines the morning of surgery with A SIP OF WATER: Pantoprazole , Famotidine , Anastrozole ,  Escitalopram , Gabapentin  and Tramadol  prn                   You may not have any metal on your body including jewelry, and body piercing  Do not wear lotions, powders, cologne, or deodorant  Men may shave face and neck.  Contacts, Hearing Aids, dentures or bridgework may not be worn into surgery. DENTURES WILL BE REMOVED PRIOR TO SURGERY PLEASE DO NOT APPLY Poly grip OR ADHESIVES!!!  You may bring a small overnight bag with you on the day of surgery, only pack items that are not valuable. [facility] IS NOT RESPONSIBLE   FOR VALUABLES THAT ARE LOST OR STOLEN.    Do not bring your home medications to the hospital. The Pharmacy will dispense medications listed on your medication list to you during your admission in the Hospital.  Special Instructions: Bring a copy of your healthcare power of attorney and living will  documents the day of surgery, if you wish to have them scanned into your [facility] Records- EPIC  Please read over the following fact sheets you were given: IF YOU HAVE QUESTIONS ABOUT YOUR PRE-OP INSTRUCTIONS, PLEASE CALL [phone].   [place] - Preparing for Surgery      Before surgery, you can play an important role.  Because skin is not sterile, your skin needs to be as free of germs as possible.  You can reduce the number of germs on your skin by washing with CHG (chlorahexidine gluconate) soap before surgery.  CHG is an  antiseptic cleaner which kills germs and bonds with the skin to continue killing germs even after washing. Please DO NOT use if you have an allergy to CHG or antibacterial soaps.  If your skin becomes reddened/irritated stop using the CHG and inform your nurse when you arrive at Short Stay. Do not shave (including legs and underarms) for at least 48 hours prior to the first CHG shower.  You may shave your face/neck.  Please follow these instructions carefully:  1.  Shower with CHG Soap the night before surgery ONLY (DO NOT USE THE CHG SOAP THE MORNING OF SURGERY).  2.  If you choose to wash your hair, wash your hair first as usual with your normal  shampoo.  3.  After you shampoo, rinse your hair and body thoroughly to remove the shampoo.                             4.  Use CHG as you would any other liquid soap.  You can apply chg directly to the skin and wash.  Gently with a scrungie or clean washcloth.  5.  Apply the CHG Soap to your body ONLY FROM THE NECK DOWN.   Do not use on face/ open                           Wound or open sores. Avoid contact with eyes, ears mouth and genitals (private parts).                       Wash face,  Genitals (private parts) with your normal soap.             6.  Wash thoroughly, paying special attention to the area where your  surgery  will be performed.  7.  Thoroughly rinse your body with warm water from the neck down.  8.  DO NOT shower/wash with your normal soap after using and rinsing off the CHG Soap.                9.  Pat yourself dry with a clean towel.            10.  Wear clean pajamas.            11.  Place clean sheets on your bed the night of your first shower and do not  sleep with pets.  Day of Surgery : Do not apply any CHG, lotions/deodorants the morning of surgery.  Please wear clean clothes to the hospital/surgery center.   FAILURE TO FOLLOW THESE INSTRUCTIONS MAY RESULT IN THE CANCELLATION OF YOUR SURGERY  PATIENT  SIGNATURE_________________________________  NURSE SIGNATURE__________________________________  ________________________________________________________________________

## 2024-03-15 ENCOUNTER — Other Ambulatory Visit: Payer: Self-pay

## 2024-03-15 ENCOUNTER — Encounter (HOSPITAL_COMMUNITY)
Admission: RE | Admit: 2024-03-15 | Discharge: 2024-03-15 | Disposition: A | Discharge status: Home / Self Care | Source: Ambulatory Visit | Attending: General Surgery | Admitting: General Surgery

## 2024-03-15 ENCOUNTER — Encounter (HOSPITAL_COMMUNITY): Payer: Self-pay

## 2024-03-15 ENCOUNTER — Ambulatory Visit: Payer: Self-pay | Admitting: General Surgery

## 2024-03-15 VITALS — BP 132/84 | HR 60 | Temp 97.9°F | Resp 14 | Ht 77.0 in | Wt 215.0 lb

## 2024-03-15 DIAGNOSIS — K449 Diaphragmatic hernia without obstruction or gangrene: Secondary | ICD-10-CM

## 2024-03-15 DIAGNOSIS — Z01812 Encounter for preprocedural laboratory examination: Secondary | ICD-10-CM | POA: Insufficient documentation

## 2024-03-15 DIAGNOSIS — Z01818 Encounter for other preprocedural examination: Secondary | ICD-10-CM | POA: Diagnosis present

## 2024-03-15 HISTORY — DX: Unspecified osteoarthritis, unspecified site: M19.90

## 2024-03-15 HISTORY — DX: Gastro-esophageal reflux disease without esophagitis: K21.9

## 2024-03-15 LAB — CBC WITH DIFFERENTIAL/PLATELET
Abs Immature Granulocytes: 0.05 K/uL (ref 0.00–0.07)
Basophils Absolute: 0.1 K/uL (ref 0.0–0.1)
Basophils Relative: 2 %
Eosinophils Absolute: 0.2 K/uL (ref 0.0–0.5)
Eosinophils Relative: 4 %
HCT: 49.9 % (ref 39.0–52.0)
Hemoglobin: 16.7 g/dL (ref 13.0–17.0)
Immature Granulocytes: 1 %
Lymphocytes Relative: 35 %
Lymphs Abs: 2.1 K/uL (ref 0.7–4.0)
MCH: 30 pg (ref 26.0–34.0)
MCHC: 33.5 g/dL (ref 30.0–36.0)
MCV: 89.6 fL (ref 80.0–100.0)
Monocytes Absolute: 0.5 K/uL (ref 0.1–1.0)
Monocytes Relative: 9 %
Neutro Abs: 3 K/uL (ref 1.7–7.7)
Neutrophils Relative %: 49 %
Platelets: 198 K/uL (ref 150–400)
RBC: 5.57 MIL/uL (ref 4.22–5.81)
RDW: 12.9 % (ref 11.5–15.5)
WBC: 6 K/uL (ref 4.0–10.5)
nRBC: 0 % (ref 0.0–0.2)

## 2024-03-15 LAB — COMPREHENSIVE METABOLIC PANEL WITH GFR
ALT: 25 U/L (ref 0–44)
AST: 25 U/L (ref 15–41)
Albumin: 4.4 g/dL (ref 3.5–5.0)
Alkaline Phosphatase: 57 U/L (ref 38–126)
Anion gap: 9 (ref 5–15)
BUN: 22 mg/dL (ref 8–23)
CO2: 28 mmol/L (ref 22–32)
Calcium: 9.9 mg/dL (ref 8.9–10.3)
Chloride: 103 mmol/L (ref 98–111)
Creatinine, Ser: 1.26 mg/dL — ABNORMAL HIGH (ref 0.61–1.24)
GFR, Estimated: >60 mL/min
Glucose, Bld: 112 mg/dL — ABNORMAL HIGH (ref 70–99)
Potassium: 4.6 mmol/L (ref 3.5–5.1)
Sodium: 140 mmol/L (ref 135–145)
Total Bilirubin: 0.9 mg/dL (ref 0.0–1.2)
Total Protein: 7.4 g/dL (ref 6.5–8.1)

## 2024-03-22 NOTE — Anesthesia Preprocedure Evaluation (Signed)
"                                    Anesthesia Evaluation  Patient identified by MRN, date of birth, ID band Patient awake    Reviewed: Allergy & Precautions, NPO status , Patient's Chart, lab work & pertinent test results  History of Anesthesia Complications Negative for: history of anesthetic complications  Airway Mallampati: I  TM Distance: >3 FB Neck ROM: Full    Dental  (+) Dental Advisory Given   Pulmonary neg pulmonary ROS   breath sounds clear to auscultation       Cardiovascular negative cardio ROS  Rhythm:Regular Rate:Normal     Neuro/Psych    Depression    negative neurological ROS     GI/Hepatic Neg liver ROS, hiatal hernia,GERD  Medicated and Controlled,,  Endo/Other  negative endocrine ROS    Renal/GU negative Renal ROS     Musculoskeletal   Abdominal   Peds  Hematology Hb 16.7, plt 198k    Anesthesia Other Findings   Reproductive/Obstetrics                              Anesthesia Physical Anesthesia Plan  ASA: 2  Anesthesia Plan: General   Post-op Pain Management: Tylenol  PO (pre-op)*   Induction: Intravenous  PONV Risk Score and Plan: 2 and Ondansetron  and Dexamethasone   Airway Management Planned: Oral ETT  Additional Equipment: None  Intra-op Plan:   Post-operative Plan: Extubation in OR  Informed Consent: I have reviewed the patients History and Physical, chart, labs and discussed the procedure including the risks, benefits and alternatives for the proposed anesthesia with the patient or authorized representative who has indicated his/her understanding and acceptance.     Dental advisory given  Plan Discussed with: CRNA and Surgeon  Anesthesia Plan Comments:          Anesthesia Quick Evaluation  "

## 2024-03-23 ENCOUNTER — Encounter (HOSPITAL_COMMUNITY): Payer: Self-pay | Admitting: General Surgery

## 2024-03-23 ENCOUNTER — Other Ambulatory Visit: Payer: Self-pay

## 2024-03-23 ENCOUNTER — Encounter (HOSPITAL_COMMUNITY): Admission: AD | Disposition: A | Payer: Self-pay | Source: Ambulatory Visit | Attending: General Surgery

## 2024-03-23 ENCOUNTER — Encounter (HOSPITAL_COMMUNITY): Payer: Self-pay | Admitting: Anesthesiology

## 2024-03-23 ENCOUNTER — Ambulatory Visit (HOSPITAL_COMMUNITY): Payer: Self-pay | Admitting: Anesthesiology

## 2024-03-23 ENCOUNTER — Observation Stay (HOSPITAL_COMMUNITY)
Admission: AD | Admit: 2024-03-23 | Discharge: 2024-03-24 | Disposition: A | Discharge status: Home / Self Care | Source: Ambulatory Visit | Attending: General Surgery | Admitting: General Surgery

## 2024-03-23 DIAGNOSIS — Z9889 Other specified postprocedural states: Principal | ICD-10-CM | POA: Diagnosis present

## 2024-03-23 DIAGNOSIS — K449 Diaphragmatic hernia without obstruction or gangrene: Principal | ICD-10-CM | POA: Insufficient documentation

## 2024-03-23 DIAGNOSIS — K219 Gastro-esophageal reflux disease without esophagitis: Secondary | ICD-10-CM | POA: Diagnosis not present

## 2024-03-23 HISTORY — PX: UPPER GI ENDOSCOPY: SHX6162

## 2024-03-23 HISTORY — PX: XI ROBOTIC ASSISTED HIATAL HERNIA REPAIR: SHX6889

## 2024-03-23 LAB — ABO/RH: ABO/RH(D): A POS

## 2024-03-23 LAB — TYPE AND SCREEN
ABO/RH(D): A POS
Antibody Screen: NEGATIVE

## 2024-03-23 MED ORDER — HYDROMORPHONE HCL 1 MG/ML IJ SOLN
INTRAMUSCULAR | Status: DC | PRN
  Administered 2024-03-23: 1 mg via INTRAVENOUS
  Administered 2024-03-23 (×2): .5 mg via INTRAVENOUS

## 2024-03-23 MED ORDER — ACETAMINOPHEN 500 MG PO TABS
1000.0000 mg | ORAL_TABLET | ORAL | Status: AC
  Administered 2024-03-23: 1000 mg via ORAL
  Filled 2024-03-23: qty 2

## 2024-03-23 MED ORDER — SUCCINYLCHOLINE 20MG/ML (10ML) SYRINGE FOR MEDFUSION PUMP - OPTIME
INTRAMUSCULAR | Status: DC | PRN
  Administered 2024-03-23: 200 mg via INTRAVENOUS

## 2024-03-23 MED ORDER — FENTANYL CITRATE (PF) 250 MCG/5ML IJ SOLN
INTRAMUSCULAR | Status: AC
  Filled 2024-03-23: qty 5

## 2024-03-23 MED ORDER — KETOROLAC TROMETHAMINE 30 MG/ML IJ SOLN
INTRAMUSCULAR | Status: DC | PRN
  Administered 2024-03-23: 30 mg via INTRAVENOUS

## 2024-03-23 MED ORDER — PHENYLEPHRINE HCL (PRESSORS) 10 MG/ML IV SOLN
INTRAVENOUS | Status: DC | PRN
  Administered 2024-03-23: 80 ug via INTRAVENOUS

## 2024-03-23 MED ORDER — OXYCODONE HCL 5 MG PO TABS
ORAL_TABLET | ORAL | Status: AC
  Filled 2024-03-23: qty 1

## 2024-03-23 MED ORDER — LIDOCAINE 2% (20 MG/ML) 5 ML SYRINGE
INTRAMUSCULAR | Status: DC | PRN
  Administered 2024-03-23: 20 mg via INTRAVENOUS

## 2024-03-23 MED ORDER — PROPOFOL 10 MG/ML IV BOLUS
INTRAVENOUS | Status: AC
  Filled 2024-03-23: qty 20

## 2024-03-23 MED ORDER — ANASTROZOLE 1 MG PO TABS
1.0000 mg | ORAL_TABLET | Freq: Every day | ORAL | Status: DC
  Administered 2024-03-24: 1 mg via ORAL
  Filled 2024-03-23: qty 1

## 2024-03-23 MED ORDER — ACETAMINOPHEN 500 MG PO TABS
1000.0000 mg | ORAL_TABLET | Freq: Four times a day (QID) | ORAL | Status: DC
  Administered 2024-03-23 – 2024-03-24 (×4): 1000 mg via ORAL
  Filled 2024-03-23 (×4): qty 2

## 2024-03-23 MED ORDER — CHLORHEXIDINE GLUCONATE 0.12 % MT SOLN: 15.0000 mL | Freq: Once | OROMUCOSAL | Status: DC

## 2024-03-23 MED ORDER — ONDANSETRON HCL 4 MG/2ML IJ SOLN
INTRAMUSCULAR | Status: AC
  Filled 2024-03-23: qty 2

## 2024-03-23 MED ORDER — ACETAMINOPHEN 500 MG PO TABS: 1000.0000 mg | ORAL_TABLET | Freq: Once | ORAL | Status: DC

## 2024-03-23 MED ORDER — OXYCODONE HCL 5 MG PO TABS
5.0000 mg | ORAL_TABLET | ORAL | Status: DC | PRN
  Administered 2024-03-23 – 2024-03-24 (×5): 10 mg via ORAL
  Filled 2024-03-23 (×5): qty 2

## 2024-03-23 MED ORDER — EPHEDRINE 5 MG/ML INJ
INTRAVENOUS | Status: AC
  Filled 2024-03-23: qty 5

## 2024-03-23 MED ORDER — BUPIVACAINE-EPINEPHRINE 0.25% -1:200000 IJ SOLN
INTRAMUSCULAR | Status: DC | PRN
  Administered 2024-03-23: 60 mL

## 2024-03-23 MED ORDER — SCOPOLAMINE 1 MG/3DAYS TD PT72
1.0000 | MEDICATED_PATCH | TRANSDERMAL | Status: DC
  Administered 2024-03-23: 1 mg via TRANSDERMAL
  Filled 2024-03-23: qty 1

## 2024-03-23 MED ORDER — SUGAMMADEX SODIUM 200 MG/2ML IV SOLN
INTRAVENOUS | Status: AC
  Filled 2024-03-23: qty 2

## 2024-03-23 MED ORDER — MIDAZOLAM HCL (PF) 2 MG/2ML IJ SOLN: 0.5000 mg | Freq: Once | INTRAMUSCULAR | Status: DC | PRN

## 2024-03-23 MED ORDER — DEXAMETHASONE SOD PHOSPHATE PF 10 MG/ML IJ SOLN
INTRAMUSCULAR | Status: AC
  Filled 2024-03-23: qty 1

## 2024-03-23 MED ORDER — OXYCODONE HCL 5 MG PO TABS
5.0000 mg | ORAL_TABLET | Freq: Once | ORAL | Status: AC | PRN
  Administered 2024-03-23: 5 mg via ORAL

## 2024-03-23 MED ORDER — KCL IN DEXTROSE-NACL 20-5-0.45 MEQ/L-%-% IV SOLN
INTRAVENOUS | Status: DC
  Filled 2024-03-23 (×3): qty 1000

## 2024-03-23 MED ORDER — MORPHINE SULFATE (PF) 2 MG/ML IV SOLN
1.0000 mg | INTRAVENOUS | Status: DC | PRN
  Administered 2024-03-23: 2 mg via INTRAVENOUS
  Filled 2024-03-23: qty 1

## 2024-03-23 MED ORDER — ROCURONIUM 10MG/ML (10ML) SYRINGE FOR MEDFUSION PUMP - OPTIME
INTRAVENOUS | Status: DC | PRN
  Administered 2024-03-23: 20 mg via INTRAVENOUS
  Administered 2024-03-23: 80 mg via INTRAVENOUS
  Administered 2024-03-23: 30 mg via INTRAVENOUS

## 2024-03-23 MED ORDER — SUCCINYLCHOLINE CHLORIDE 200 MG/10ML IV SOSY
PREFILLED_SYRINGE | INTRAVENOUS | Status: AC
  Filled 2024-03-23: qty 10

## 2024-03-23 MED ORDER — ONDANSETRON 4 MG PO TBDP
4.0000 mg | ORAL_TABLET | Freq: Four times a day (QID) | ORAL | Status: DC | PRN
  Administered 2024-03-24: 4 mg via ORAL
  Filled 2024-03-23: qty 1

## 2024-03-23 MED ORDER — ORAL CARE MOUTH RINSE: 15.0000 mL | Freq: Once | OROMUCOSAL | Status: DC

## 2024-03-23 MED ORDER — DEXAMETHASONE SOD PHOSPHATE PF 10 MG/ML IJ SOLN
4.0000 mg | INTRAMUSCULAR | Status: AC
  Administered 2024-03-23: 10 mg via INTRAVENOUS

## 2024-03-23 MED ORDER — PANTOPRAZOLE SODIUM 40 MG IV SOLR
40.0000 mg | Freq: Two times a day (BID) | INTRAVENOUS | Status: DC
  Administered 2024-03-23 – 2024-03-24 (×2): 40 mg via INTRAVENOUS
  Filled 2024-03-23 (×2): qty 10

## 2024-03-23 MED ORDER — CHLORHEXIDINE GLUCONATE CLOTH 2 % EX PADS: 6.0000 | MEDICATED_PAD | Freq: Once | CUTANEOUS | Status: DC

## 2024-03-23 MED ORDER — KETOROLAC TROMETHAMINE 15 MG/ML IJ SOLN
15.0000 mg | Freq: Three times a day (TID) | INTRAMUSCULAR | Status: DC
  Administered 2024-03-23 – 2024-03-24 (×3): 15 mg via INTRAVENOUS
  Filled 2024-03-23 (×3): qty 1

## 2024-03-23 MED ORDER — MELATONIN 3 MG PO TABS
3.0000 mg | ORAL_TABLET | Freq: Every evening | ORAL | Status: DC | PRN
  Administered 2024-03-23: 3 mg via ORAL
  Filled 2024-03-23: qty 1

## 2024-03-23 MED ORDER — LACTATED RINGERS IV SOLN: INTRAVENOUS | Status: DC

## 2024-03-23 MED ORDER — GABAPENTIN 100 MG PO CAPS
300.0000 mg | ORAL_CAPSULE | Freq: Three times a day (TID) | ORAL | Status: DC
  Administered 2024-03-23 – 2024-03-24 (×2): 300 mg via ORAL
  Filled 2024-03-23 (×2): qty 3

## 2024-03-23 MED ORDER — MIDAZOLAM HCL (PF) 2 MG/2ML IJ SOLN
INTRAMUSCULAR | Status: DC | PRN
  Administered 2024-03-23: 2 mg via INTRAVENOUS

## 2024-03-23 MED ORDER — ROCURONIUM BROMIDE 10 MG/ML (PF) SYRINGE
PREFILLED_SYRINGE | INTRAVENOUS | Status: AC
  Filled 2024-03-23: qty 10

## 2024-03-23 MED ORDER — HYDROMORPHONE HCL 1 MG/ML IJ SOLN
INTRAMUSCULAR | Status: AC
  Filled 2024-03-23: qty 1

## 2024-03-23 MED ORDER — SODIUM CHLORIDE 0.9 % IV SOLN
12.5000 mg | Freq: Four times a day (QID) | INTRAVENOUS | Status: DC | PRN
  Administered 2024-03-23 – 2024-03-24 (×2): 12.5 mg via INTRAVENOUS
  Filled 2024-03-23 (×2): qty 12.5

## 2024-03-23 MED ORDER — HEPARIN SODIUM (PORCINE) 5000 UNIT/ML IJ SOLN
5000.0000 [IU] | Freq: Once | INTRAMUSCULAR | Status: AC
  Administered 2024-03-23: 5000 [IU] via SUBCUTANEOUS
  Filled 2024-03-23: qty 1

## 2024-03-23 MED ORDER — HYDROMORPHONE HCL 2 MG/ML IJ SOLN
INTRAMUSCULAR | Status: AC
  Filled 2024-03-23: qty 1

## 2024-03-23 MED ORDER — PROPOFOL 10 MG/ML IV BOLUS
INTRAVENOUS | Status: DC | PRN
  Administered 2024-03-23: 200 mg via INTRAVENOUS

## 2024-03-23 MED ORDER — LIDOCAINE HCL (PF) 2 % IJ SOLN
INTRAMUSCULAR | Status: AC
  Filled 2024-03-23: qty 5

## 2024-03-23 MED ORDER — CEFAZOLIN SODIUM-DEXTROSE 2-4 GM/100ML-% IV SOLN
2.0000 g | INTRAVENOUS | Status: AC
  Administered 2024-03-23: 2 g via INTRAVENOUS
  Filled 2024-03-23: qty 100

## 2024-03-23 MED ORDER — BUPIVACAINE-EPINEPHRINE (PF) 0.25% -1:200000 IJ SOLN
INTRAMUSCULAR | Status: AC
  Filled 2024-03-23: qty 60

## 2024-03-23 MED ORDER — MIDAZOLAM HCL 2 MG/2ML IJ SOLN
INTRAMUSCULAR | Status: AC
  Filled 2024-03-23: qty 2

## 2024-03-23 MED ORDER — DIPHENHYDRAMINE HCL 12.5 MG/5ML PO ELIX: 12.5000 mg | ORAL_SOLUTION | Freq: Four times a day (QID) | ORAL | Status: DC | PRN

## 2024-03-23 MED ORDER — DIPHENHYDRAMINE HCL 50 MG/ML IJ SOLN: 12.5000 mg | Freq: Four times a day (QID) | INTRAMUSCULAR | Status: DC | PRN

## 2024-03-23 MED ORDER — ONDANSETRON HCL 4 MG/2ML IJ SOLN
INTRAMUSCULAR | Status: DC | PRN
  Administered 2024-03-23: 4 mg via INTRAVENOUS

## 2024-03-23 MED ORDER — HYDROMORPHONE HCL 1 MG/ML IJ SOLN
0.2500 mg | INTRAMUSCULAR | Status: DC | PRN
  Administered 2024-03-23: 0.5 mg via INTRAVENOUS

## 2024-03-23 MED ORDER — LACTATED RINGERS IR SOLN
Status: DC | PRN
  Administered 2024-03-23: 1000 mL

## 2024-03-23 MED ORDER — EPHEDRINE SULFATE (PRESSORS) 25 MG/5ML IV SOSY
PREFILLED_SYRINGE | INTRAVENOUS | Status: DC | PRN
  Administered 2024-03-23: 15 mg via INTRAVENOUS
  Administered 2024-03-23: 10 mg via INTRAVENOUS

## 2024-03-23 MED ORDER — OXYCODONE HCL 5 MG/5ML PO SOLN: 5.0000 mg | Freq: Once | ORAL | Status: AC | PRN

## 2024-03-23 MED ORDER — ENOXAPARIN SODIUM 40 MG/0.4ML IJ SOSY
40.0000 mg | PREFILLED_SYRINGE | INTRAMUSCULAR | Status: DC
  Administered 2024-03-24: 40 mg via SUBCUTANEOUS
  Filled 2024-03-23: qty 0.4

## 2024-03-23 MED ORDER — FENTANYL CITRATE (PF) 250 MCG/5ML IJ SOLN
INTRAMUSCULAR | Status: DC | PRN
  Administered 2024-03-23 (×2): 100 ug via INTRAVENOUS
  Administered 2024-03-23: 50 ug via INTRAVENOUS

## 2024-03-23 MED ORDER — ONDANSETRON HCL 4 MG/2ML IJ SOLN
4.0000 mg | Freq: Four times a day (QID) | INTRAMUSCULAR | Status: DC | PRN
  Administered 2024-03-23 (×2): 4 mg via INTRAVENOUS
  Filled 2024-03-23 (×3): qty 2

## 2024-03-23 MED ORDER — MEPERIDINE HCL 25 MG/ML IJ SOLN: 6.2500 mg | INTRAMUSCULAR | Status: DC | PRN

## 2024-03-23 MED ORDER — SIMETHICONE 80 MG PO CHEW
40.0000 mg | CHEWABLE_TABLET | Freq: Four times a day (QID) | ORAL | Status: DC | PRN
  Administered 2024-03-23: 40 mg via ORAL

## 2024-03-23 NOTE — Anesthesia Postprocedure Evaluation (Signed)
"   Anesthesia Post Note  Patient: REGIS HINTON  Procedure(s) Performed: REPAIR, HERNIA, HIATAL, ROBOT-ASSISTED (Abdomen) ENDOSCOPY, UPPER GI TRACT     Patient location during evaluation: PACU Anesthesia Type: General Level of consciousness: awake and alert, oriented and patient cooperative Pain management: pain level controlled Vital Signs Assessment: post-procedure vital signs reviewed and stable Respiratory status: spontaneous breathing, nonlabored ventilation and respiratory function stable Cardiovascular status: blood pressure returned to baseline and stable Postop Assessment: no apparent nausea or vomiting Anesthetic complications: no   No notable events documented.  Last Vitals:  Vitals:   03/23/24 1230 03/23/24 1245  BP: (!) 157/83 (!) 144/96  Pulse: (!) 55 87  Resp: 20 12  Temp:    SpO2: 97% 96%    Last Pain:  Vitals:   03/23/24 1245  TempSrc:   PainSc: 4                  Jager Koska,E. Josh Nicolosi      "

## 2024-03-23 NOTE — Transfer of Care (Signed)
 Immediate Anesthesia Transfer of Care Note  Patient: Harry Levy  Procedure(s) Performed: REPAIR, HERNIA, HIATAL, ROBOT-ASSISTED (Abdomen) ENDOSCOPY, UPPER GI TRACT  Patient Location: PACU  Anesthesia Type:General  Level of Consciousness: drowsy and patient cooperative  Airway & Oxygen Therapy: Patient Spontanous Breathing and Patient connected to face mask oxygen  Post-op Assessment: Report given to RN and Post -op Vital signs reviewed and stable  Post vital signs: Reviewed and stable  Last Vitals:  Vitals Value Taken Time  BP    Temp    Pulse 77 03/23/24 10:44  Resp 12 03/23/24 10:44  SpO2 98 % 03/23/24 10:44  Vitals shown include unfiled device data.  Last Pain:  Vitals:   03/23/24 0609  TempSrc: Oral  PainSc: 0-No pain         Complications: No notable events documented.

## 2024-03-23 NOTE — Anesthesia Procedure Notes (Signed)
 Procedure Name: Intubation Date/Time: 03/23/2024 7:36 AM  Performed by: Nanci Riis, CRNAPre-anesthesia Checklist: Patient identified, Emergency Drugs available, Suction available, Timeout performed and Patient being monitored Patient Re-evaluated:Patient Re-evaluated prior to induction Oxygen Delivery Method: Circle system utilized Preoxygenation: Pre-oxygenation with 100% oxygen Induction Type: IV induction, Rapid sequence and Cricoid Pressure applied Laryngoscope Size: Miller and 3 Grade View: Grade II Tube type: Oral Tube size: 7.5 mm Number of attempts: 1 Airway Equipment and Method: Stylet Placement Confirmation: ETT inserted through vocal cords under direct vision, positive ETCO2 and breath sounds checked- equal and bilateral Secured at: 22 cm Tube secured with: Tape Dental Injury: Teeth and Oropharynx as per pre-operative assessment

## 2024-03-23 NOTE — H&P (Signed)
 PROVIDER: Kamsiyochukwu Buist DARALYN BLUSH, MD  MRN: [id number] DOB: January 12, 1956 DATE OF ENCOUNTER: 03/17/2024 Subjective  Chief Complaint: follow up (Discuss hiatal hernia repair )   History of Present Illness Harry Levy is a 69 year old male with hiatal hernia and chronic gastroesophageal reflux disease who presents for preoperative evaluation prior to planned anti-reflux surgery.  He experiences well-controlled daytime reflux symptoms with Protonix , but continues to have significant nocturnal regurgitation. Nighttime episodes result in refluxate rising into his throat, causing sore throat and raspy voice, which is present this morning. He describes awakening at night with reflux in the posterior oropharynx, requiring water intake and upright positioning for relief, followed by persistent throat soreness the next day. Even minor deviations from dietary and lifestyle modifications precipitate severe nocturnal symptoms.  He denies dysphagia, odynophagia, or sensation of food impaction. Burping and belching are rare. He attempts to sleep with his head elevated, which provides partial relief, but he is unable to maintain this position comfortably throughout the night.  He is currently taking Protonix  twice daily, including at bedtime, and notes that missed doses result in worsening heartburn. Despite significant dietary and lifestyle modifications, he continues to experience refractory nocturnal symptoms. Bowel movements are stable, with mild variation between mild constipation and mildly loose stools, unchanged for years.  He reports increased anxiety regarding the upcoming surgery in the week prior to the procedure.  Review of Systems: A complete review of systems was obtained from the patient. I have reviewed this information and discussed as appropriate with the patient. See HPI as well for other ROS.  ROS  Medical History: History reviewed. No pertinent past medical history.  There is no problem  list on file for this patient.  History reviewed. No pertinent surgical history.  No Known Allergies  Current Outpatient Medications on File Prior to Visit Medication Sig Dispense Refill anastrozole  (ARIMIDEX ) 1 mg tablet Take 1 mg by mouth once daily escitalopram  oxalate (LEXAPRO ) 5 MG tablet Take 5 mg by mouth once daily meloxicam  (MOBIC ) 15 MG tablet Take 15 mg by mouth once daily testosterone  cypionate (DEPO-TESTOSTERONE ) 200 mg/mL injection escitalopram  oxalate (LEXAPRO ) 10 MG tablet Take 10 mg by mouth once daily famotidine  (PEPCID ) 40 MG tablet Take 40 mg by mouth at bedtime gabapentin  (NEURONTIN ) 300 MG capsule Take 300 mg by mouth 3 (three) times daily pantoprazole  (PROTONIX ) 40 MG DR tablet Take 40 mg by mouth once daily testosterone  cypionate (DEPO-TESTOSTERONE ) 100 mg/mL injection (Patient not taking: Reported on 03/17/2024)  No current facility-administered medications on file prior to visit.  Family History Problem Relation Age of Onset High blood pressure (Hypertension) Father   Social History  Tobacco Use Smoking Status Never Smokeless Tobacco Never   Social History  Socioeconomic History Marital status: Divorced Tobacco Use Smoking status: Never Smokeless tobacco: Never Substance and Sexual Activity Alcohol use: Yes Alcohol/week: 2.0 - 4.0 standard drinks of alcohol Types: 2 - 4 Glasses of wine per week Drug use: Never  Social Drivers of Catering Manager Strain: Low Risk (04/01/2023) Received from [facility] Overall Financial Resource Strain (CARDIA) Difficulty of Paying Living Expenses: Not hard at all Food Insecurity: No Food Insecurity (04/01/2023) Received from [facility] Hunger Vital Sign Within the past 12 months, you worried that your food would run out before you got the money to buy more.: Never true Within the past 12 months, the food you bought just didn't last and you didn't have money to get more.: Never true Transportation  Needs: No Transportation Needs (  04/01/2023) Received from [facility] - Transportation Lack of Transportation (Medical): No Lack of Transportation (Non-Medical): No Physical Activity: Sufficiently Active (04/01/2023) Received from [facility] Exercise Vital Sign On average, how many days per week do you engage in moderate to strenuous exercise (like a brisk walk)?: 4 days On average, how many minutes do you engage in exercise at this level?: 40 min Stress: No Stress Concern Present (04/01/2023) Received from [facility] - Occupational Stress Questionnaire Feeling of Stress : Only a little Social Connections: Moderately Integrated (04/01/2023) Received from [facility] Social Connection and Isolation Panel In a typical week, how many times do you talk on the phone with family, friends, or neighbors?: More than three times a week How often do you get together with friends or relatives?: More than three times a week How often do you attend church or religious services?: More than 4 times per year Do you belong to any clubs or organizations such as church groups, unions, fraternal or athletic groups, or school groups?: Yes How often do you attend meetings of the clubs or organizations you belong to?: More than 4 times per year Are you married, widowed, divorced, separated, never married, or living with a partner?: Divorced Housing Stability: Unknown (04/08/2023) Housing Stability Vital Sign Homeless in the Last Year: No  Objective:  Vitals: 03/17/24 0841 Temp: 36.7 C (98 F) PainSc: 0-No pain  There is no height or weight on file to calculate BMI.  Wt Readings from Last 10 Encounters: 04/08/23 98.6 kg (217 lb 6.4 oz)  Gen: alert, NAD, non-toxic appearing Pupils: equal, no scleral icterus Pulm: Lungs clear to auscultation, symmetric chest rise CV: regular rate and rhythm Abd: soft, nontender, nondistended. No cellulitis. No incisional  hernia Ext: no edema, Skin: no rash, no jaundice  Labs, Imaging and Diagnostic Testing: Manometry 01/03/23  EGD 11/17/2018  Upper GI 11/15/2022 FINDINGS: Ventral osteophytes at C5-C6 and C6-C7 without significant effacement of the hypopharynx/cervical esophagus.  Fluoroscopic evaluation demonstrates normal caliber and smooth contour of the esophagus. No evidence of fixed stricture, mass or mucosal abnormality.  Moderate intermittent esophageal dysmotility with tertiary contractions.  Small hiatal hernia.  Large-volume spontaneous gastroesophageal reflux observed to the level of the upper thoracic esophagus.  The patient swallowed a 13 mm barium tablet, which freely passed into the stomach.  IMPRESSION: 1. Small hiatal hernia. 2. Large-volume spontaneous gastroesophageal reflux observed to the level of the upper thoracic esophagus. 3. Moderate esophageal dysmotility with tertiary contractions.  Labs 03/15/24 cbc, cmet reviewed  Assessment and Plan: Diagnoses and all orders for this visit:  Hiatal hernia with GERD    Assessment & Plan Hiatal hernia with Gastroesophageal Reflux Disease (GERD) He has a small hiatal hernia with chronic, GERD, characterized by persistent nocturnal regurgitation and laryngeal symptoms despite maximal medical and lifestyle management. Daytime symptoms are well controlled with PPIs, but nocturnal regurgitation remains significant. Esophageal manometry is normal, and he denies dysphagia and odynophagia. We discussed pros and cons of operative intervention. He is maximized nonoperative management for his nocturnal symptoms and is still having breakthrough symptoms at night. Robotic Nissen fundoplication with hiatal hernia repair is indicated due to refractory symptoms and impact on quality of life. We rediscussed the steps of the procedure, the typical hospitalization and the typical recovery. Postoperative adjustment is expected due to the wrap and  reduced gastric capacity, with gradual accommodation over 1-2 years. Risks discussed include skin incision inflammation/infection (rare), rash, injury to intra-abdominal or thoracic  structures (lung, aorta--extremely rare), pneumothorax (rare, usually self-limited), recurrence of hiatal hernia (~15% long-term), thromboembolism, wrap failure/slippage, vagus nerve injury (risk of diarrhea/gastroparesis), persistent symptoms/need for reflux medication, and mesh-related complications (not anticipated for this case). Anticipated benefits include ~85% long-term success rate for symptom control and likely improvement in quality of life, with potential to discontinue PPIs, though occasional antacid or PPI use may be needed long-term. He expressed understanding and preference for surgery after shared decision making. - Proceed with robotic Nissen fundoplication and hiatal hernia repair. - Perform intraoperative upper endoscopy (EGD) at the end of the procedure to assess esophageal integrity and wrap. - Obtain water-soluble upper GI series on postoperative day one to document anatomy and assess for complications and degree of edema. - Advance diet postoperatively in staged fashion: clear liquids, then full liquids (~1.5 weeks), pureed foods (~1-1.5 weeks), soft foods (~1-1.5 weeks), then solids by ~6 weeks. - Provided sample postoperative diet menu and anticipatory guidance regarding portion size, eating speed, and food texture. - Continue Protonix  postoperatively; plan to attempt weaning after recovery, with expectation that occasional antacid or PPI use may be needed long-term. - Prescribed postoperative medications: ondansetron  for nausea, acetaminophen , oxycodone  for severe pain, simethicone /Mylanta as needed. - Restrict heavy lifting (>10-15 lbs) for one month postoperatively. - Encourage early ambulation and light activity (e.g., walking, light exercise bike) to reduce risk of thromboembolism; avoid prolonged  immobility. - Advise avoidance of crowded places and minimize risk of respiratory illness (e.g., influenza) in first 6 weeks to prevent coughing/retching and increased intra-abdominal pressure. - Educated regarding possible postoperative symptoms: gas, belching, altered bowel movements, temporary reduced gastric capacity, and long-term dietary quirks (e.g., difficulty with breads, thick proteins, water intolerance). - Recommend trial of alkaline water if plain water is poorly tolerated postoperatively. The patient was given an educational booklet regarding GERD which also talks about hiatal hernias. I discussed the anatomy of the foregut. We discussed the evaluation and workup for heartburn and hiatal hernias.  We then discussed steps of a robotic hiatal hernia repair with possible mesh with fundoplication/wrap. We discussed the typical hospitalization. We discussed the typical recovery. We discussed the diet transition over the course of 8 to 12 weeks. We discussed the need to change eating techniques and behaviors in order to mitigate postoperative pain and discomfort. We discussed that hiatal hernia repair/reflux surgery is not perfect . We discussed that there are typical quirks but our goal is to plan and only have minor quirks. We discussed what some of those quirks could be such as issues with tolerance to certain food textures like breads or pastas, burping. We did discuss the possibility of needing ongoing reflux medication.  We then discussed the risk of surgery included but not limited to bleeding, infection, injury to surrounding structures, injury to the lung/aorta, injury to the vagus nerves, hiatal hernia recurrence, clot formation, perioperative cardiac and pulmonary events. We discussed slipped wrap. We discussed the issues with vagal nerve injury such as gastroparesis and diarrhea. We discussed gas bloat. We discussed issues with potential mesh insertion such as dysphagia/fistula. We  discussed that I am typically selective in whether or not we use absorbable mesh. We discussed the recurrence risk is at least 15%. We discussed that with subsequent diaphragm/esophageal surgery it is technically more complicated and higher risk as well as satisfaction scores decrease.  We also discussed pros and cons of not undergoing repair  This patient encounter took 32 minutes today to perform the following: take history, perform exam, review  outside records, interpret imaging, counsel the patient on their diagnosis and document encounter, findings & plan in the EHR  No follow-ups on file.  This note has been created using automated tools and reviewed for accuracy by Jersey Ravenscroft MCADAMS Jaramie Bastos.  Camellia HERO. Tanda MD FACS General, Minimally Invasive, & Bariatric Surgery Electronically signed by Tanda Camellia Armour, MD at 03/17/2024 12:17 PM EST

## 2024-03-23 NOTE — Op Note (Signed)
 03/23/2024  10:55 AM  PATIENT:  Harry Levy  69 y.o. male  PRE-OPERATIVE DIAGNOSIS:  HIATAL HERNIA WITH GERD with esophagitis  POST-OPERATIVE DIAGNOSIS: Type I hiatal hernia with gerd with esophagitis  PROCEDURE:  Procedures: REPAIR, HERNIA, HIATAL, ROBOT-ASSISTED with Nissen fundoplication (56 fr bougie) ENDOSCOPY, UPPER GI TRACT  SURGEON:  Surgeon(s): Tanda Locus, MD  ASSISTANTS: Herlene Bureau MD    ANESTHESIA:   general  EBL: minimal  DRAINS: none   LOCAL MEDICATIONS USED:  MARCAINE      SPECIMEN:  Source of Specimen:  hernia sac,perigastric fat  DISPOSITION OF SPECIMEN:  discarded  COUNTS:  YES  INDICATION FOR PROCEDURE: 69 year old gentleman with chronic GERD on PPI who has well-controlled daytime symptoms but has uncontrolled nighttime symptoms mainly consisting of nocturnal regurgitation.  Episodes include refluxate into the throat causing sore throat and raspy voice.  He also reports awakening at night with reflux requiring water intake and changing position.  He has tried behavioral modification and medication and is just not getting relief.  He had workup including upper GI, EGD, esophageal manometry.  We met several times to discuss pros and cons of surgical intervention along with risk and benefits and typical outcomes along with common side effects and complications.  He elected proceed with surgery.  PROCEDURE:   Patient received 5000 units of subcutaneous heparin  preoperatively.  Patient was given Tylenol  and Decadron  and Zofran  preoperatively.  After informed consent was obtained patient was taken to the operating room at [facility].  He was placed upon on the operating room table.  General endotracheal anesthesia was established.  Sequential compression devices were placed.  Usual standard surgical fashion with ChloraPrep.  The patient was on IV antibiotic prior to skin incision.  A surgical timeout was performed.   Access to the abdomen was  obtained using the Optiview technique in the left mid abdomen about 8 cm to the left of the umbilicus in the midclavicular line.  Using a 0 degree 5 mm laparoscope through a 5 mm trocar it was advanced through all layers of the abdominal wall and the abdominal cavity was carefully entered.  Pneumoperitoneum was smoothly established without any change in patient vital signs.  The laparoscope was advanced and the abdominal cavity was surveilled.  There was no evidence of injury.  Patient was placed in reverse Trendelenburg.  A robotic 8 trocar was placed just above and to the left of the umbilicus.  An additional 8 mm robotic trocar was placed in the right mid abdomen and a assistant 5 mm trocar was placed in the right lateral abdominal wall.  We exchanged the Optiview trocar for a robotic 8 trocar and a final 8 mm robotic trocar was placed in the left lateral abdominal wall.  A bilateral laparoscopic tap block was performed for postoperative pain relief.  A Nathanson liver retractor was placed through a subxiphoid small incision to lift up the left lobe of the liver providing excellent visualization of the diaphragm.  The Nisource robot was then docked to the trocars.  A tip up grasper in arm 4, vessel sealer in arm 3, camera in arm 2 and a fenestrated bipolar in arm 1.  At this time I went to the surgeon console while my assistant stayed at the bedside.  An assistant was needed to help with tissue retraction and manipulation given the complexity of the case.  The patient had a small type 1 hiatal hernia.  The GE junction was above  the diaphragm.     I started my approach on the right side incising the gastropathic ligament using the pars flaccida technique.  We were able to identify the edge of the hernia sac and the herniated perigastric tissue.  This was incised with the vessel sealer and then I gently started doing blunt mobilization along with taking down adhesive bands with the vessel sealer.  The  assistant provided retraction on the perigastric fat.  I was able to peel and get into the space of the hernia sac.  I continued this toward the anterior part of the diaphragm.  Patient had a little bit of a hernia sac but nothing significant..  We identified the plane between the hernia sac and the peritoneum along the anterior diaphragm.  He was a little bit more adhered anteriorly along the anterior diaphragm and along the left crus reflecting probably some chronic esophagitis.  This was again taken down in sequential fashion using the vessel sealer.  Then with some gentle blunt dissection reduced some of the right sided hernia sac.    We identified the confluence of the left and right crura.  I then turned my attention and started taking the hernia sac down along the left anterior diaphragm toward the left crus.  We then decided to mobilize and take down some of the short gastric vessels off the proximal greater curvature of the stomach.  These were taken down in sequential fashion all the way up to the cardia and to the left crus.  The phrenogastric ligament was taken down with vessel sealer.  again the assistant was provided retraction of the stomach in order to help me visualize the rater curvature.  Again he was a little bit more stuck along the left crus but I was able to identify the peritoneum of the crura and peel it away from the hernia sac with both gentle and blunt dissection with the vessel sealer and taking it down where needed with the vessel sealer.  We took down some of the posterior hernia sac At the level of the diaphragm crura.   We then turned our attention posteriorly to the junction of the left and right crus posteriorly.  I was able then to pass a Penrose drain around the retroesophageal area.  My assistant held this as I continued and started circumferential mobilization of the proximal stomach and esophagus in the mediastinum.  The aorta was visualized.  I did see a right vagus nerve. We  also visualized a left vagus nerve.  I continued to circumferential mobilization of the intrathoracic esophagus using gentle blunt dissection with the vessel sealer as well as taking down avascular attachments and some vascular attachments with vessel sealer.  The aorta was identified and protected.  We had also previously identified the left and right vagus nerves running along the esophagus and those were protected as well.  I continued to take down circumferential attachments of the esophagus the left, right, anterior and posterior.  This time I felt we had achieved enough intra-abdominal esophageal length so I went to the head of the bed and grabbed the endoscope and placed it in the patient's oropharynx and gently glided it down under direct visualization.  The esophagus appeared normal.  There is no evidence of esophageal injury.  The endoscope was advanced into the stomach and the stomach was insufflated.  Pulled back on the endoscope and we identified the Z-line and transilluminated this was still just at the level of the diaphragmatic crura  it was not intra-abdominal yet.  So I decompressed the stomach and withdrew the endoscope.  Since the Z-line was still at the diaphragmatic crura not intra-abdominal yet I felt we needed to mobilize some additional proximal esophagus in order to get some additional intra-abdominal esophageal length.  Back to the surgical console a and while my assistant provided retraction on the Penrose continue taking down some more proximal attachments of the esophagus circumferentially using some gentle sweeps of the vessel sealer as well as taking down some attachments with the vessel sealer.  We were able to get an additional 2 cm of length.  We were fairly high in the mediastinum at this point.  I did not feel like we need to do any more proximal mobilization.  Again the vagus nerves had been identified and preserved and the pleura was not violated.  We had achieved about 3-1/2 to  4 cm of intra-abdominal esophageal length.   We then removed the small hernia sac using the vessel sealer.  We reduced intra-abdominal pressure to 10 mmHg.  The crura defect was probably about 3-1/2 cm long at this point.  I then reapproximated the left and right crura with 5  interrupted 0 Ethibond sutures.  The left and right crura came together without tension and her crura appeared healthy so I did not place mesh.  Since we had achieved enough intra-abdominal esophageal length I felt we could proceed with a Nissen fundoplication.   The posterior fundus was marked with a 2-0 silk suture to help orientate the wrap/fundoplication. I grasped the greater curvature of the stomach and reflecting it to the patients right side, exposing the posterior fundus. The suture was placed 3 cm distal to the gastroesophageal junction    The posterior fundus is passed behind the esophagus from left to right. The anterior fundus is grasped 2 cm from the greater curvature and 3 cm distal to the gastroesophageal junction. It is then brought in front of the esophagus to join the posterior fundus at the 10 o'clock position on the anterior aspect of the esophagus.  Shoeshine maneuver was performed demonstrating proper orientation.  We then had the CRNA passed a lighted 56 French bougie and we saw it to go down into the stomach and it passed easily.  The fundus was grasped anteriorly and posteriorly at a position equidistant from the greater curvature to avoid incorporation of the gastric body into the wrap. Three  seromuscular sutures 0 Ethibond were  placed from left to right in these 3 structures. First is the anterior fundus, second is a seromuscular bite of the esophagus, and lastly is the posterior fundus. The sutures were placed to cover a total of 2.5 -3 cm of the intra-abdominal esophagus.  The bougie was removed.  The hernia sac and perigastric fat that had been taken down were removed along with the needles.  I then  went back to the head of the bed and obtained the endoscope and gently placed it down his oropharynx down the esophagus.  I did not see any evidence of esophageal injury.  The scope was advanced into the stomach.  The stomach was insufflated and retroflexed on itself to visualize the GE junction.  There appeared to be a good posterior 360 degree wrap in proper orientation.  The stomach was desufflated and the endoscope was removed.  Penrose drain had also been removed  The fundus was brought behind the esophagus to create a posterior 360 degree wrap.  A 0 Ethibond  suture was placed between the esophagus and the posterior fundus in 3 separate locations along the right side.  In a similar fashion, on the left side a 0 Ethibond suture was placed in 3 locations incorporating a bite of the fundus and the left anterior side of the esophagus.  A separate 0 Ethibond suture was placed between the right side of the wrap to the crura.  In a similar fashion on the left side a 0 Ethibond suture was placed between the apex of the wrap on the left side to the crura on the left.  Liver retractor was removed by the assistant.   The robot was undocked.  I Scrubbed back in.  Trocars removed and the skin incisions were closed with a 4 Monocryl followed by the application of Steri-Strips, 2 x 2 and Tegaderm.  All needle, instrument, and sponge counts were correct x 2.  There were no immediate complications.  Patient tolerated suture well.  Patient was extubated and taken recovery room in stable condition.  He did have some crepitus in his neck and face at the conclusion of the procedure   PLAN OF CARE: Admit for overnight observation   PATIENT DISPOSITION:  PACU - hemodynamically stable.   Delay start of Pharmacological VTE agent (>24hrs) due to surgical blood loss or risk of bleeding:  no   PLAN OF CARE: Admit for overnight observation  PATIENT DISPOSITION:  PACU - hemodynamically stable.   Delay start of  Pharmacological VTE agent (>24hrs) due to surgical blood loss or risk of bleeding:  no  Camellia HERO. Tanda, MD, FACS General, Bariatric, & Minimally Invasive Surgery [facility]

## 2024-03-23 NOTE — Interval H&P Note (Signed)
 History and Physical Interval Note:  03/23/2024 6:28 AM  Harry Levy  has presented today for surgery, with the diagnosis of HIATAL HERNIA WITH GERD.  The various methods of treatment have been discussed with the patient and family. After consideration of risks, benefits and other options for treatment, the patient has consented to  Procedures with comments: REPAIR, HERNIA, HIATAL, ROBOT-ASSISTED (N/A) - ROBOTIC HIATAL HERNIA REPAIR WITH FUNDOPLICATION POSSIBLE MESH UPPER ENDOSCOPY ENDOSCOPY, UPPER GI TRACT (N/A) as a surgical intervention.  The patient's history has been reviewed, patient examined, no change in status, stable for surgery.  I have reviewed the patient's chart and labs.  Questions were answered to the patient's satisfaction.    All questions asked and answered Rediscussed typical postop course [name]

## 2024-03-24 ENCOUNTER — Observation Stay (HOSPITAL_COMMUNITY)

## 2024-03-24 ENCOUNTER — Encounter (HOSPITAL_COMMUNITY): Payer: Self-pay | Admitting: General Surgery

## 2024-03-24 DIAGNOSIS — K449 Diaphragmatic hernia without obstruction or gangrene: Secondary | ICD-10-CM | POA: Diagnosis not present

## 2024-03-24 LAB — CBC
HCT: 41 % (ref 39.0–52.0)
Hemoglobin: 13.9 g/dL (ref 13.0–17.0)
MCH: 30.8 pg (ref 26.0–34.0)
MCHC: 33.9 g/dL (ref 30.0–36.0)
MCV: 90.7 fL (ref 80.0–100.0)
Platelets: 166 K/uL (ref 150–400)
RBC: 4.52 MIL/uL (ref 4.22–5.81)
RDW: 13.3 % (ref 11.5–15.5)
WBC: 9.3 K/uL (ref 4.0–10.5)
nRBC: 0 % (ref 0.0–0.2)

## 2024-03-24 LAB — BASIC METABOLIC PANEL WITH GFR
Anion gap: 8 (ref 5–15)
BUN: 14 mg/dL (ref 8–23)
CO2: 29 mmol/L (ref 22–32)
Calcium: 9.3 mg/dL (ref 8.9–10.3)
Chloride: 106 mmol/L (ref 98–111)
Creatinine, Ser: 1.06 mg/dL (ref 0.61–1.24)
GFR, Estimated: >60 mL/min
Glucose, Bld: 121 mg/dL — ABNORMAL HIGH (ref 70–99)
Potassium: 4.6 mmol/L (ref 3.5–5.1)
Sodium: 142 mmol/L (ref 135–145)

## 2024-03-24 LAB — MAGNESIUM: Magnesium: 2.1 mg/dL (ref 1.7–2.4)

## 2024-03-24 MED ORDER — BACLOFEN 10 MG PO TABS: 10.0000 mg | ORAL_TABLET | Freq: Three times a day (TID) | ORAL | 0 refills | Status: DC | PRN

## 2024-03-24 MED ORDER — BOOST / RESOURCE BREEZE PO LIQD CUSTOM
1.0000 | Freq: Two times a day (BID) | ORAL | Status: DC
  Administered 2024-03-24 (×2): 1 via ORAL

## 2024-03-24 MED ORDER — IOHEXOL 300 MG/ML  SOLN
150.0000 mL | Freq: Once | INTRAMUSCULAR | Status: AC | PRN
  Administered 2024-03-24: 150 mL via ORAL

## 2024-03-24 MED ORDER — OXYCODONE HCL 5 MG PO TABS: 5.0000 mg | ORAL_TABLET | Freq: Four times a day (QID) | ORAL | 0 refills | Status: DC | PRN

## 2024-03-24 MED ORDER — PANTOPRAZOLE SODIUM 40 MG PO TBEC: 40.0000 mg | DELAYED_RELEASE_TABLET | Freq: Two times a day (BID) | ORAL | Status: DC

## 2024-03-24 MED ORDER — ACETAMINOPHEN 500 MG PO TABS: 1000.0000 mg | ORAL_TABLET | Freq: Three times a day (TID) | ORAL | Status: AC

## 2024-03-24 MED ORDER — ONDANSETRON 4 MG PO TBDP: 4.0000 mg | ORAL_TABLET | Freq: Three times a day (TID) | ORAL | 1 refills | Status: DC | PRN

## 2024-03-24 NOTE — Discharge Instructions (Signed)
EATING AFTER YOUR ESOPHAGEAL SURGERY (Stomach Fundoplication, Hiatal Hernia repair, Achalasia surgery, etc)  ######################################################################  EAT Start with a full liquid diet (see below) Gradually transition to a high fiber diet with a fiber supplement over the next month after discharge.    WALK Walk an hour a day.  Control your pain to do that.    CONTROL PAIN Control pain so that you can walk, sleep, tolerate sneezing/coughing, go up/down stairs.  HAVE A BOWEL MOVEMENT DAILY Keep your bowels regular to avoid problems.  OK to try a laxative to override constipation.  OK to use an antidairrheal to slow down diarrhea.  Call if not better after 2 tries  CALL IF YOU HAVE PROBLEMS/CONCERNS Call if you are still struggling despite following these instructions. Call if you have concerns not answered by these instructions  ######################################################################   After your esophageal surgery, expect some sticking with swallowing over the next 1-2 months.    If food sticks when you eat, it is called "dysphagia".  This is due to swelling around your esophagus at the wrap & hiatal diaphragm repair.  It will gradually ease off over the next few months.  To help you through this temporary phase, we start you out on a full liquid diet.  Your first meal in the hospital was thin liquids.  You should have been given a full liquid diet by the time you left the hospital. Stay on clears and full liquids for the first week. Some patients may need to stay on a liquid diet for up to 2 weeks if having trouble swallowing.  Once tolerating that well, you can advance to pureed diet.   We ask patients to stay on a pureed diet for the 2nd-3rd week to avoid anything getting "stuck" near your recent surgery.  Don't be alarmed if your ability to swallow doesn't progress according to this plan.  Everyone is different and some diets can advance  more or less quickly.    It is often helpful to crush your medications or split them as they can sometimes stick, especially the first week or so.   Some BASIC RULES to follow are: Maintain an upright position whenever eating or drinking. Take small bites - just a teaspoon size bite at a time. Eat slowly.  It may also help to eat only one food at a time. Consider nibbling through smaller, more frequent meals & avoid the urge to eat BIG meals Do not push through feelings of fullness, nausea, or bloatedness Do not mix solid foods and liquids in the same mouthful Try not to "wash foods down" with large gulps of liquids. Avoid carbonated (bubbly/fizzy) drinks.   Avoid foods that make you feel gassy or bloated.  Start with bland foods first.  Wait on trying greasy, fried, or spicy meals until you are tolerating more bland solids well. Understand that it will be hard to burp and belch at first.  This gradually improves with time.  Expect to be more gassy/flatulent/bloated initially.  Walking will help your body manage it better. Consider using medications for bloating that contain simethicone such as  Maalox or Gas-X  Consider crushing her medications, especially smaller pills.  The ability to swallow pills should get easier after a few weeks Eat in a relaxed atmosphere & minimize distractions. Avoid talking while eating.   Do not use straws. Following each meal, sit in an upright position (90 degree angle) for 60 to 90 minutes.  Going for a short walk can   help as well If food does stick, don't panic.  Try to relax and let the food pass on its own.  Sipping WARM LIQUID such as strong hot black tea can also help slide it down.   Be gradual in changes & use common sense:  -If you easily tolerating a certain "level" of foods, advance to the next level gradually -If you are having trouble swallowing a particular food, then avoid it.   -If food is sticking when you advance your diet, go back to  thinner previous diet (the lower LEVEL) for 1-2 days.  LEVEL 2 = PUREED DIET  Start 1- 2 WEEKS AFTER SURGERY IF YOU ARE TOLERATING A FULL LIQUID DIET EASILY  -Foods in this group are pureed or blenderized to a smooth, mashed potato-like consistency.  -If necessary, the pureed foods can keep their shape with the addition of a thickening agent.   -Meat should be pureed to a smooth, pasty consistency.  Hot broth or gravy may be added to the pureed meat, approximately 1 oz. of liquid per 3 oz. serving of meat. -CAUTION:  If any foods do not puree into a smooth consistency, swallowing will be more difficult.  (For example, nuts or seeds sometimes do not blend well.)  Hot Foods Cold Foods  Pureed scrambled eggs and cheese Pureed cottage cheese  Baby cereals Thickened juices and nectars  Thinned cooked cereals (no lumps) Thickened milk or eggnog  Pureed French toast or pancakes Ensure  Mashed potatoes Ice cream  Pureed parsley, au gratin, scalloped potatoes, candied sweet potatoes Fruit or Italian ice, sherbet  Pureed buttered or alfredo noodles Plain yogurt  Pureed vegetables (no corn or peas) Instant breakfast  Pureed soups and creamed soups Smooth pudding, mousse, custard  Pureed scalloped apples Whipped gelatin  Gravies Sugar, syrup, honey, jelly  Sauces, cheese, tomato, barbecue, white, creamed Cream  Any baby food Creamer  Alcohol in moderation (not beer or champagne) Margarine  Coffee or tea Mayonnaise   Ketchup, mustard   Apple sauce   SAMPLE MENU:  PUREED DIET Breakfast Lunch Dinner  Orange juice, 1/2 cup Cream of wheat, 1/2 cup Pineapple juice, 1/2 cup Pureed turkey, barley soup, 3/4 cup Pureed Hawaiian chicken, 3 oz  Scrambled eggs, mashed or blended with cheese, 1/2 cup Tea or coffee, 1 cup  Whole milk, 1 cup  Non-dairy creamer, 2 Tbsp. Mashed potatoes, 1/2 cup Pureed cooled broccoli, 1/2 cup Apple sauce, 1/2 cup Coffee or tea Mashed potatoes, 1/2 cup Pureed spinach,  1/2 cup Frozen yogurt, 1/2 cup Tea or coffee      LEVEL 3 = SOFT DIET  After your first 4 weeks, you can advance to a soft diet.   Keep on this diet until everything goes down easily.  Hot Foods Cold Foods  White fish Cottage cheese  Stuffed fish Junior baby fruit  Baby food meals Semi thickened juices  Minced soft cooked, scrambled, poached eggs nectars  Souffle & omelets Ripe mashed bananas  Cooked cereals Canned fruit, pineapple sauce, milk  potatoes Milkshake  Buttered or Alfredo noodles Custard  Cooked cooled vegetable Puddings, including tapioca  Sherbet Yogurt  Vegetable soup or alphabet soup Fruit ice, Italian ice  Gravies Whipped gelatin  Sugar, syrup, honey, jelly Junior baby desserts  Sauces:  Cheese, creamed, barbecue, tomato, white Cream  Coffee or tea Margarine   SAMPLE MENU:  LEVEL 3 Breakfast Lunch Dinner  Orange juice, 1/2 cup Oatmeal, 1/2 cup Scrambled eggs with cheese, 1/2 cup Decaffeinated tea,   1 cup Whole milk, 1 cup Non-dairy creamer, 2 Tbsp Pineapple juice, 1/2 cup Minced beef, 3 oz Gravy, 2 Tbsp Mashed potatoes, 1/2 cup Minced fresh broccoli, 1/2 cup Applesauce, 1/2 cup Coffee, 1 cup Turkey, barley soup, 3/4 cup Minced Hawaiian chicken, 3 oz Mashed potatoes, 1/2 cup Cooked spinach, 1/2 cup Frozen yogurt, 1/2 cup Non-dairy creamer, 2 Tbsp      LEVEL 4 = CHOPPED DIET  -After all the foods in level 3 (soft diet) are passing through well you should advance up to more chopped foods.  -It is still important to cut these foods into small pieces and eat slowly.  Hot Foods Cold Foods  Poultry Cottage cheese  Chopped Swedish meatballs Yogurt  Meat salads (ground or flaked meat) Milk  Flaked fish (tuna) Milkshakes  Poached or scrambled eggs Soft, cold, dry cereal  Souffles and omelets Fruit juices or nectars  Cooked cereals Chopped canned fruit  Chopped French toast or pancakes Canned fruit cocktail  Noodles or pasta (no rice) Pudding,  mousse, custard  Cooked vegetables (no frozen peas, corn, or mixed vegetables) Green salad  Canned small sweet peas Ice cream  Creamed soup or vegetable soup Fruit ice, Italian ice  Pureed vegetable soup or alphabet soup Non-dairy creamer  Ground scalloped apples Margarine  Gravies Mayonnaise  Sauces:  Cheese, creamed, barbecue, tomato, white Ketchup  Coffee or tea Mustard   SAMPLE MENU:  LEVEL 4 Breakfast Lunch Dinner  Orange juice, 1/2 cup Oatmeal, 1/2 cup Scrambled eggs with cheese, 1/2 cup Decaffeinated tea, 1 cup Whole milk, 1 cup Non-dairy creamer, 2 Tbsp Ketchup, 1 Tbsp Margarine, 1 tsp Salt, 1/4 tsp Sugar, 2 tsp Pineapple juice, 1/2 cup Ground beef, 3 oz Gravy, 2 Tbsp Mashed potatoes, 1/2 cup Cooked spinach, 1/2 cup Applesauce, 1/2 cup Decaffeinated coffee Whole milk Non-dairy creamer, 2 Tbsp Margarine, 1 tsp Salt, 1/4 tsp Pureed turkey, barley soup, 3/4 cup Barbecue chicken, 3 oz Mashed potatoes, 1/2 cup Ground fresh broccoli, 1/2 cup Frozen yogurt, 1/2 cup Decaffeinated tea, 1 cup Non-dairy creamer, 2 Tbsp Margarine, 1 tsp Salt, 1/4 tsp Sugar, 1 tsp    LEVEL 5:  REGULAR FOODS  -Foods in this group are soft, moist, regularly textured foods.   -This level includes meat and breads, which tend to be the hardest things to swallow.   -Eat very slowly, chew well and continue to avoid carbonated drinks. -most people are at this level in 6 weeks  Hot Foods Cold Foods  Baked fish or skinned Soft cheeses - cottage cheese  Souffles and omelets Cream cheese  Eggs Yogurt  Stuffed shells Milk  Spaghetti with meat sauce Milkshakes  Cooked cereal Cold dry cereals (no nuts, dried fruit, coconut)  French toast or pancakes Crackers  Buttered toast Fruit juices or nectars  Noodles or pasta (no rice) Canned fruit  Potatoes (all types) Ripe bananas  Soft, cooked vegetables (no corn, lima, or baked beans) Peeled, ripe, fresh fruit  Creamed soups or vegetable soup Cakes  (no nuts, dried fruit, coconut)  Canned chicken noodle soup Plain doughnuts  Gravies Ice cream  Bacon dressing Pudding, mousse, custard  Sauces:  Cheese, creamed, barbecue, tomato, white Fruit ice, Italian ice, sherbet  Decaffeinated tea or coffee Whipped gelatin  Pork chops Regular gelatin   Canned fruited gelatin molds   Sugar, syrup, honey, jam, jelly   Cream   Non-dairy   Margarine   Oil   Mayonnaise   Ketchup   Mustard   TROUBLESHOOTING IRREGULAR BOWELS    1) Avoid extremes of bowel movements (no bad constipation/diarrhea)  2) Miralax 17gm mixed in 8oz. water or juice-daily. May use BID as needed.  3) Gas-x,Phazyme, etc. as needed for gas & bloating.  4) Soft,bland diet. No spicy,greasy,fried foods.  5) Prilosec over-the-counter as needed  6) May hold gluten/wheat products from diet to see if symptoms improve.  7) May try probiotics (Align, Activa, etc) to help calm the bowels down  7) If symptoms become worse call back immediately.    If you have any questions please call our office at [facility]: [phone].  

## 2024-03-24 NOTE — Progress Notes (Signed)
" °   03/24/24 1309  TOC Brief Assessment  Insurance and Status Reviewed  Patient has primary care physician Yes  Home environment has been reviewed home with s/o  Prior level of function: independent  Prior/Current Home Services No current home services  Social Drivers of Health Review SDOH reviewed no interventions necessary  Readmission risk has been reviewed Yes  Transition of care needs no transition of care needs at this time    "

## 2024-03-24 NOTE — Discharge Summary (Signed)
 " Physician Discharge Summary  Harry Levy FMW:[id number] DOB: January 17, 1956 DOA: 03/23/2024  PCP: Garald Karlynn GAILS, MD  Admit date: 03/23/2024 Discharge date: 03/24/2024  Recommendations for Outpatient Follow-up:     Follow-up Information     Tanda Locus, MD. Go on 04/21/2024.   Specialty: General Surgery Why: 9:30 am for postop appt Contact information: [address] [place] [address] [phone]                Discharge Diagnoses:  Hiatal hernia with GERD with esophagitis  Surgical Procedure: Robotic assisted repair of type I hiatal hernia with Nissen fundoplication, upper endoscopy March 23, 2024  Discharge Condition: Good Disposition: Home  Diet recommendation: Clear liquid to full liquid diet  Filed Weights   03/23/24 0609  Weight: 97 kg    History of present illness:   69 year old gentleman with chronic GERD on PPI who has well-controlled daytime symptoms but has uncontrolled nighttime symptoms mainly consisting of nocturnal regurgitation.  Episodes include refluxate into the throat causing sore throat and raspy voice.  He also reports awakening at night with reflux requiring water intake and changing position.  He has tried behavioral modification and medication and is just not getting relief.  He had workup including upper GI, EGD, esophageal manometry.  We met several times to discuss pros and cons of surgical intervention along with risk and benefits and typical outcomes along with common side effects and complications.  He elected proceed with surgery.   Hospital Course:  He was brought in for planned surgical repair of his hiatal hernia and fundoplication.  Please see operative note for additional details.  He was kept overnight for observation.  He was started on clear liquids on postop day 0.  He did have some discomfort in his chest and left shoulder and back which was expected given the pneumoperitoneum and the work we did in the  mediastinum.  He did not have hardly any nausea.  He did have some discomfort at times with oral intake but no vomiting or regurgitation.  He was maintained on chemical VTE prophylaxis perioperatively.  On postop day 1 he was tolerating clear liquids.  He did have some burping at times.  He underwent an upper GI that demonstrated no leak or extravasation, intact hiatal hernia repair and intact wrap with some edema at the GE junction which was not unexpected.  He was advanced to full liquids and protein shakes.  I rounded on him earlier this morning.  I then contacted him via phone a short time ago to see how he was doing.  He states that the thicker liquids are actually going down better than the clear liquids.  He did have some burping after oral intake.  No nausea.  Pain was controlled on oral medication.  He had ambulated multiple times.  His vital signs were stable.  We felt that he had achieved enough oral intake for safe discharge.  We discussed discharge instructions again this morning.  We also discussed the typical recovery.  We discussed pain control.  We discussed intraoperative findings.  We discussed diet progression.  We discussed what to be on the look out for.  We discussed what to call for.  BP 117/74 (BP Location: Right Arm)   Pulse 61   Temp 97.6 F (36.4 C) (Oral)   Resp 18   Ht 6' 5 (1.956 m)   Wt 97 kg   SpO2 96%   BMI 25.36 kg/m  Gen: alert, NAD, non-toxic appearing Pupils: equal, no scleral icterus Pulm:  symmetric chest rise CV: regular rate and rhythm Abd: soft, min tender, nondistended.  No cellulitis. No incisional hernia Ext: no edema, no calf tenderness Skin: no rash, no jaundice   Discharge Instructions  Discharge Instructions     Call MD for:   Complete by: As directed    Temperature >101   Call MD for:  hives   Complete by: As directed    Call MD for:  persistant dizziness or light-headedness   Complete by: As directed    Call MD for:  persistant  nausea and vomiting   Complete by: As directed    Call MD for:  redness, tenderness, or signs of infection (pain, swelling, redness, odor or green/yellow discharge around incision site)   Complete by: As directed    Call MD for:  severe uncontrolled pain   Complete by: As directed    Diet full liquid   Complete by: As directed    Discharge instructions   Complete by: As directed    See [facility] discharge instructions   Increase activity slowly   Complete by: As directed       Allergies as of 03/24/2024   No Known Allergies      Medication List     STOP taking these medications    cyclobenzaprine  10 MG tablet Commonly known as: FLEXERIL    Fish Oil 1000 MG Caps   glycopyrrolate  2 MG tablet Commonly known as: Robinul -Forte   meloxicam  15 MG tablet Commonly known as: MOBIC    multivitamin tablet   traMADol  50 MG tablet Commonly known as: ULTRAM        TAKE these medications    anastrozole  1 MG tablet Commonly known as: ARIMIDEX  Take 1 tablet (1 mg total) by mouth daily.   B-D 3CC LUER-LOK SYR 25GX1 25G X 1 3 ML Misc Generic drug: SYRINGE-NEEDLE (DISP) 3 ML Use as directed   baclofen  10 MG tablet Commonly known as: LIORESAL  Take 1 tablet (10 mg total) by mouth 3 (three) times daily as needed for muscle spasms.   BD Disp Needles 18G X 1-1/2 Misc Generic drug: NEEDLE (DISP) 18 G Use as directed   CoQ10 100 MG Caps Take 100 mg by mouth daily.   escitalopram  10 MG tablet Commonly known as: Lexapro  Take 1 tablet (10 mg total) by mouth daily. What changed: how much to take   famotidine  40 MG tablet Commonly known as: Pepcid  Take 1 tablet (40 mg total) by mouth daily. What changed: when to take this   gabapentin  300 MG capsule Commonly known as: NEURONTIN  TAKE ONE CAPSULE BY MOUTH THREE TIMES DAILY What changed:  when to take this additional instructions   hyoscyamine  0.125 MG SL tablet Commonly known as: LEVSIN  SL Take 1-2 by mouth 4 times a day  prn   Lumify 0.025 % Soln Generic drug: Brimonidine Tartrate Place 1 drop into both eyes in the morning.   ondansetron  4 MG disintegrating tablet Commonly known as: ZOFRAN -ODT Take 1 tablet (4 mg total) by mouth every 8 (eight) hours as needed for nausea or vomiting.   oxyCODONE  5 MG immediate release tablet Commonly known as: Oxy IR/ROXICODONE  Take 1 tablet (5 mg total) by mouth every 6 (six) hours as needed for severe pain (pain score 7-10).   pantoprazole  40 MG tablet Commonly known as: PROTONIX  TAKE 1 TABLET BY MOUTH TWICE DAILY BEFORE MEALS   Refresh Optive 1-0.9 % Gel Generic drug: Carboxymethylcellul-Glycerin  Place 1 drop into both eyes See admin instructions. Instill 1 drop into both eyes midday and at bedtime   testosterone  cypionate 200 MG/ML injection Commonly known as: DEPOTESTOSTERONE CYPIONATE Inject 1 mL (200 mg total) into the muscle every 2 weeks. What changed:  how much to take when to take this   Vitamin D3 50 MCG (2000 UT) capsule Take 1 capsule (2,000 Units total) by mouth daily.        Follow-up Information     Tanda Locus, MD. Go on 04/21/2024.   Specialty: General Surgery Why: 9:30 am for postop appt Contact information: [address] [place] [address] [phone]                  The results of significant diagnostics from this hospitalization (including imaging, microbiology, ancillary and laboratory) are listed below for reference.    Significant Diagnostic Studies: DG UGI W SINGLE CM (SOL OR THIN BA) Result Date: 03/24/2024 CLINICAL DATA:  Status post Nissen fundoplication EXAM: WATER SOLUBLE UPPER GI SERIES TECHNIQUE: Single-column upper GI series was performed using water soluble contrast. Radiation Exposure Index (as provided by the fluoroscopic device): 35.3 mGy Kerma CONTRAST:  150 mL of Omnipaque  300 orally COMPARISON:  None Available. FLUOROSCOPY: Radiation Exposure Index (if provided by the  fluoroscopic device): 35.3 mGy FINDINGS: There is noted moderate to severe persistent narrowing of the gastroesophageal junction most consistent with postoperative edema. No definite evidence of contrast leakage or extravasation is noted. There was some degree of standing column of contrast in distal esophagus most likely related to narrowing of gastroesophageal junction. Grossly normal contrast filling of stomach is noted. Contrast flowed easily from the stomach into duodenum. Visualized portion of duodenum is unremarkable. There is no evidence of residual hiatal hernia. IMPRESSION: Moderate to severe persistent narrowing of gastroesophageal junction is noted most consistent with postoperative edema due to fundoplication yesterday. There is no evidence of contrast extravasation or leakage. Some degree of standing column of contrast is noted in distal esophagus most likely related to narrowing of gastroesophageal junction. No evidence of residual hiatal hernia. Electronically Signed   By: Lynwood Landy Raddle M.D.   On: 03/24/2024 09:08    Microbiology: No results found for this or any previous visit (from the past 240 hours).   Labs: Basic Metabolic Panel: Recent Labs  Lab 03/24/24 0518  NA 142  K 4.6  CL 106  CO2 29  GLUCOSE 121*  BUN 14  CREATININE 1.06  CALCIUM 9.3  MG 2.1    CBC: Recent Labs  Lab 03/24/24 0518  WBC 9.3  HGB 13.9  HCT 41.0  MCV 90.7  PLT 166    Principal Problem:   S/P Nissen fundoplication (without gastrostomy tube) procedure   Time coordinating discharge: 25 min  Signed:  Locus CHRISTELLA Tanda, MD [facility] [phone] 03/24/2024, 1:41 PM  "

## 2024-03-24 NOTE — Progress Notes (Signed)
 Patient discharged home, IV removed, discharge paperwork provided and explained to patient as well as patient's spouse, both patient and patient's spouse verbalized understanding.

## 2024-03-24 NOTE — Care Management Obs Status (Signed)
 MEDICARE OBSERVATION STATUS NOTIFICATION   Patient Details  Name: Harry Levy MRN: [id number] Date of Birth: 04/02/1955   Medicare Observation Status Notification Given:  Chaney NORMAN ASPEN, LCSW 03/24/2024, 1:07 PM

## 2024-03-26 ENCOUNTER — Other Ambulatory Visit: Payer: Self-pay | Admitting: Family Medicine

## 2024-04-06 ENCOUNTER — Encounter: Payer: Commercial Managed Care - PPO | Admitting: Internal Medicine

## 2024-04-06 ENCOUNTER — Telehealth: Payer: Self-pay

## 2024-04-06 ENCOUNTER — Ambulatory Visit

## 2024-04-06 NOTE — Telephone Encounter (Signed)
 Copied from CRM #[id number]. Topic: Appointments - Scheduling Inquiry for Clinic >> Apr 06, 2024  8:15 AM Harlene ORN wrote: Reason for CRM: Patient called to follow up on his telephone appointment AWV with his PCP. Informed the patient that his appointment was cancelled by his Provider due to the Practice's change in opening hours and that he was rescheduled to 06/03/2024. The appointment was not scheduled yet under Future appointments, so please schedule. Also, call the patient back when available to discuss. Also, his office visit for 01/30; his AWV was supposed to be before this appointment.

## 2024-04-09 ENCOUNTER — Encounter: Payer: Self-pay | Admitting: Internal Medicine

## 2024-04-09 ENCOUNTER — Ambulatory Visit: Admitting: Internal Medicine

## 2024-04-09 VITALS — BP 124/78 | HR 61 | Temp 97.7°F | Ht 77.0 in | Wt 215.0 lb

## 2024-04-09 DIAGNOSIS — R202 Paresthesia of skin: Secondary | ICD-10-CM

## 2024-04-09 DIAGNOSIS — E291 Testicular hypofunction: Secondary | ICD-10-CM

## 2024-04-09 DIAGNOSIS — Z0000 Encounter for general adult medical examination without abnormal findings: Secondary | ICD-10-CM

## 2024-04-09 DIAGNOSIS — K219 Gastro-esophageal reflux disease without esophagitis: Secondary | ICD-10-CM

## 2024-04-09 DIAGNOSIS — E559 Vitamin D deficiency, unspecified: Secondary | ICD-10-CM

## 2024-04-09 LAB — TSH: TSH: 1.55 u[IU]/mL (ref 0.35–5.50)

## 2024-04-09 LAB — VITAMIN D 25 HYDROXY (VIT D DEFICIENCY, FRACTURES): VITD: 35.3 ng/mL (ref 30.00–100.00)

## 2024-04-09 LAB — LIPID PANEL
Cholesterol: 220 mg/dL — ABNORMAL HIGH (ref 28–200)
HDL: 52.3 mg/dL
LDL Cholesterol: 154 mg/dL — ABNORMAL HIGH (ref 10–99)
NonHDL: 167.77
Total CHOL/HDL Ratio: 4
Triglycerides: 68 mg/dL (ref 10.0–149.0)
VLDL: 13.6 mg/dL (ref 0.0–40.0)

## 2024-04-09 LAB — VITAMIN B12: Vitamin B-12: 307 pg/mL (ref 211–911)

## 2024-04-09 LAB — PSA: PSA: 2.09 ng/mL (ref 0.10–4.00)

## 2024-04-09 NOTE — Progress Notes (Signed)
 "  Subjective:  Patient ID: Harry Levy, male    DOB: [birth year]  Age: 69 y.o. MRN: [id number]  CC: Annual Exam   HPI Harry Levy presents for a well exam  Outpatient Medications Prior to Visit  Medication Sig Dispense Refill   anastrozole  (ARIMIDEX ) 1 MG tablet Take 1 tablet (1 mg total) by mouth daily. 90 tablet 3   Cholecalciferol (VITAMIN D3) 2000 units capsule Take 1 capsule (2,000 Units total) by mouth daily. 100 capsule 3   Coenzyme Q10 (COQ10) 100 MG CAPS Take 100 mg by mouth daily.     escitalopram  (LEXAPRO ) 10 MG tablet Take 1 tablet (10 mg total) by mouth daily. (Patient taking differently: Take 5 mg by mouth daily.) 90 tablet 3   famotidine  (PEPCID ) 40 MG tablet Take 1 tablet (40 mg total) by mouth daily. (Patient taking differently: Take 40 mg by mouth every evening.) 90 tablet 3   gabapentin  (NEURONTIN ) 300 MG capsule TAKE ONE CAPSULE BY MOUTH THREE TIMES DAILY (Patient taking differently: Take 300 mg by mouth See admin instructions. Take 300 mg by mouth in the evening and an additional 300 mg two times a day as needed for pain) 90 capsule 3   LUMIFY 0.025 % SOLN Place 1 drop into both eyes in the morning.     meloxicam  (MOBIC ) 15 MG tablet TAKE 1 TABLET(15 MG) BY MOUTH DAILY 30 tablet 0   NEEDLE, DISP, 18 G (BD DISP NEEDLES) 18G X 1-1/2 MISC Use as directed 12 each PRN   pantoprazole  (PROTONIX ) 40 MG tablet TAKE 1 TABLET BY MOUTH TWICE DAILY BEFORE MEALS 180 tablet 3   REFRESH OPTIVE 1-0.9 % GEL Place 1 drop into both eyes See admin instructions. Instill 1 drop into both eyes midday and at bedtime     SYRINGE-NEEDLE, DISP, 3 ML (B-D 3CC LUER-LOK SYR 25GX1) 25G X 1 3 ML MISC Use as directed 20 each 3   testosterone  cypionate (DEPOTESTOSTERONE CYPIONATE) 200 MG/ML injection Inject 1 mL (200 mg total) into the muscle every 2 weeks. (Patient taking differently: Inject 80 mg into the muscle every Sunday.) 10 mL 1   baclofen  (LIORESAL ) 10 MG tablet Take 1 tablet (10 mg  total) by mouth 3 (three) times daily as needed for muscle spasms. 90 tablet 0   hyoscyamine  (LEVSIN  SL) 0.125 MG SL tablet Take 1-2 by mouth 4 times a day prn (Patient not taking: Reported on 03/23/2024) 30 tablet 6   ondansetron  (ZOFRAN -ODT) 4 MG disintegrating tablet Take 1 tablet (4 mg total) by mouth every 8 (eight) hours as needed for nausea or vomiting. 20 tablet 1   oxyCODONE  (OXY IR/ROXICODONE ) 5 MG immediate release tablet Take 1 tablet (5 mg total) by mouth every 6 (six) hours as needed for severe pain (pain score 7-10). 30 tablet 0   No facility-administered medications prior to visit.    ROS: Review of Systems  Constitutional:  Negative for appetite change, fatigue and unexpected weight change.  HENT:  Negative for congestion, nosebleeds, sneezing, sore throat and trouble swallowing.   Eyes:  Negative for itching and visual disturbance.  Respiratory:  Negative for cough.   Cardiovascular:  Negative for chest pain, palpitations and leg swelling.  Gastrointestinal:  Negative for abdominal distention, blood in stool, diarrhea and nausea.  Genitourinary:  Negative for frequency and hematuria.  Musculoskeletal:  Negative for back pain, gait problem, joint swelling and neck pain.  Skin:  Negative for rash.  Neurological:  Negative for dizziness, tremors,  speech difficulty and weakness.  Psychiatric/Behavioral:  Negative for agitation, dysphoric mood, sleep disturbance and suicidal ideas. The patient is not nervous/anxious.     Objective:  BP 124/78   Pulse 61   Temp 97.7 F (36.5 C) (Oral)   Ht 6' 5 (1.956 m)   Wt 215 lb (97.5 kg)   SpO2 97%   BMI 25.50 kg/m   BP Readings from Last 3 Encounters:  04/09/24 124/78  03/24/24 127/81  03/15/24 132/84    Wt Readings from Last 3 Encounters:  04/09/24 215 lb (97.5 kg)  03/23/24 213 lb 13.5 oz (97 kg)  03/15/24 215 lb (97.5 kg)    Physical Exam Constitutional:      General: He is not in acute distress.    Appearance: He  is well-developed.     Comments: NAD  Eyes:     Conjunctiva/sclera: Conjunctivae normal.     Pupils: Pupils are equal, round, and reactive to light.  Neck:     Thyroid : No thyromegaly.     Vascular: No JVD.  Cardiovascular:     Rate and Rhythm: Normal rate and regular rhythm.     Heart sounds: Normal heart sounds. No murmur heard.    No friction rub. No gallop.  Pulmonary:     Effort: Pulmonary effort is normal. No respiratory distress.     Breath sounds: Normal breath sounds. No wheezing or rales.  Chest:     Chest wall: No tenderness.  Abdominal:     General: Bowel sounds are normal. There is no distension.     Palpations: Abdomen is soft. There is no mass.     Tenderness: There is no abdominal tenderness. There is no guarding or rebound.  Musculoskeletal:        General: No tenderness. Normal range of motion.     Cervical back: Normal range of motion.  Lymphadenopathy:     Cervical: No cervical adenopathy.  Skin:    General: Skin is warm and dry.     Findings: No rash.  Neurological:     Mental Status: He is alert and oriented to person, place, and time.     Cranial Nerves: No cranial nerve deficit.     Motor: No abnormal muscle tone.     Coordination: Coordination normal.     Gait: Gait normal.     Deep Tendon Reflexes: Reflexes are normal and symmetric.  Psychiatric:        Behavior: Behavior normal.        Thought Content: Thought content normal.        Judgment: Judgment normal.     Lab Results  Component Value Date   WBC 9.3 03/24/2024   HGB 13.9 03/24/2024   HCT 41.0 03/24/2024   PLT 166 03/24/2024   GLUCOSE 121 (H) 03/24/2024   CHOL 216 (H) 04/03/2023   TRIG 54.0 04/03/2023   HDL 57.70 04/03/2023   LDLCALC 147 (H) 04/03/2023   ALT 25 03/15/2024   AST 25 03/15/2024   NA 142 03/24/2024   K 4.6 03/24/2024   CL 106 03/24/2024   CREATININE 1.06 03/24/2024   BUN 14 03/24/2024   CO2 29 03/24/2024   TSH 0.93 04/03/2023   PSA 1.58 05/11/2020   HGBA1C  5.6 05/12/2020    DG UGI W SINGLE CM (SOL OR THIN BA) Result Date: 03/24/2024 CLINICAL DATA:  Status post Nissen fundoplication EXAM: WATER SOLUBLE UPPER GI SERIES TECHNIQUE: Single-column upper GI series was performed using water soluble contrast. Radiation  Exposure Index (as provided by the fluoroscopic device): 35.3 mGy Kerma CONTRAST:  150 mL of Omnipaque  300 orally COMPARISON:  None Available. FLUOROSCOPY: Radiation Exposure Index (if provided by the fluoroscopic device): 35.3 mGy FINDINGS: There is noted moderate to severe persistent narrowing of the gastroesophageal junction most consistent with postoperative edema. No definite evidence of contrast leakage or extravasation is noted. There was some degree of standing column of contrast in distal esophagus most likely related to narrowing of gastroesophageal junction. Grossly normal contrast filling of stomach is noted. Contrast flowed easily from the stomach into duodenum. Visualized portion of duodenum is unremarkable. There is no evidence of residual hiatal hernia. IMPRESSION: Moderate to severe persistent narrowing of gastroesophageal junction is noted most consistent with postoperative edema due to fundoplication yesterday. There is no evidence of contrast extravasation or leakage. Some degree of standing column of contrast is noted in distal esophagus most likely related to narrowing of gastroesophageal junction. No evidence of residual hiatal hernia. Electronically Signed   By: Lynwood Landy Raddle M.D.   On: 03/24/2024 09:08    Assessment & Plan:   Problem List Items Addressed This Visit     Paresthesia   Relevant Orders   Vitamin B12   GERD (gastroesophageal reflux disease)   S/p Robotic assisted repair of type I hiatal hernia with Nissen fundoplication, upper endoscopy March 23, 2024 - Dr Tanda      Well adult exam   We discussed age appropriate health related issues, including available/recomended screening tests and vaccinations. We  discussed a need for adhering to healthy diet and exercise. Labs were ordered to be later reviewed . All questions were answered. Colon 2009 and 2020 Dr Abran, due 2030 Eye exam, Derm exam q 12 mo Dental q 6 mo 2021 Coronary calcium score of 0. [employer] retired in Summer of 2024 S/p Robotic assisted repair of type I hiatal hernia with Nissen fundoplication, upper endoscopy March 23, 2024 - Dr Tanda      Relevant Orders   PSA   TSH   Lipid panel   Hypogonadism in male - Primary   On testosterone .  Follow-up with Dr. Matilda.  Labs      Other Visit Diagnoses       Vitamin D  deficiency       Relevant Orders   VITAMIN D  25 Hydroxy (Vit-D Deficiency, Fractures)         No orders of the defined types were placed in this encounter.     Follow-up: Return in about 1 year (around 04/09/2025) for a follow-up visit.  Marolyn Noel, MD "

## 2024-04-09 NOTE — Assessment & Plan Note (Signed)
On testosterone.  Follow-up with Dr. Diona Fanti.  Labs

## 2024-04-09 NOTE — Assessment & Plan Note (Signed)
 S/p Robotic assisted repair of type I hiatal hernia with Nissen fundoplication, upper endoscopy March 23, 2024 - Dr Tanda

## 2024-04-09 NOTE — Assessment & Plan Note (Addendum)
 We discussed age appropriate health related issues, including available/recomended screening tests and vaccinations. We discussed a need for adhering to healthy diet and exercise. Labs were ordered to be later reviewed . All questions were answered. Colon 2009 and 2020 Dr Abran, due 2030 Eye exam, Derm exam q 12 mo Dental q 6 mo 2021 Coronary calcium score of 0. [employer] retired in Summer of 2024 S/p Robotic assisted repair of type I hiatal hernia with Nissen fundoplication, upper endoscopy March 23, 2024 - Dr Tanda

## 2024-04-12 ENCOUNTER — Ambulatory Visit: Payer: Self-pay | Admitting: Internal Medicine

## 2024-04-13 ENCOUNTER — Ambulatory Visit

## 2024-07-02 ENCOUNTER — Ambulatory Visit
